# Patient Record
Sex: Male | Born: 1950 | Race: White | Hispanic: No | Marital: Married | Smoking: Former smoker
Health system: Southern US, Community
[De-identification: ages and names within clinical notes are randomized; demographics above are authoritative.]

## PROBLEM LIST (undated history)

## (undated) DIAGNOSIS — E039 Hypothyroidism, unspecified: Secondary | ICD-10-CM

## (undated) DIAGNOSIS — E785 Hyperlipidemia, unspecified: Secondary | ICD-10-CM

## (undated) DIAGNOSIS — R059 Cough, unspecified: Secondary | ICD-10-CM

## (undated) DIAGNOSIS — R911 Solitary pulmonary nodule: Secondary | ICD-10-CM

## (undated) DIAGNOSIS — K635 Polyp of colon: Secondary | ICD-10-CM

## (undated) DIAGNOSIS — M25569 Pain in unspecified knee: Secondary | ICD-10-CM

## (undated) DIAGNOSIS — N400 Enlarged prostate without lower urinary tract symptoms: Secondary | ICD-10-CM

## (undated) DIAGNOSIS — R05 Cough: Secondary | ICD-10-CM

## (undated) DIAGNOSIS — Z720 Tobacco use: Secondary | ICD-10-CM

## (undated) HISTORY — PX: OTHER SURGICAL HISTORY: SHX169

## (undated) HISTORY — PX: EYE SURGERY: SHX253

## (undated) HISTORY — DX: Hypothyroidism, unspecified: E03.9

## (undated) HISTORY — DX: Benign prostatic hyperplasia without lower urinary tract symptoms: N40.0

## (undated) HISTORY — DX: Hyperlipidemia, unspecified: E78.5

## (undated) HISTORY — DX: Tobacco use: Z72.0

## (undated) HISTORY — DX: Cough: R05

## (undated) HISTORY — DX: Solitary pulmonary nodule: R91.1

## (undated) HISTORY — DX: Pain in unspecified knee: M25.569

## (undated) HISTORY — DX: Cough, unspecified: R05.9

## (undated) HISTORY — DX: Polyp of colon: K63.5

## (undated) HISTORY — PX: TONSILLECTOMY: SUR1361

---

## 2009-06-16 LAB — HM MAMMOGRAPHY

## 2009-06-16 LAB — HM PAP SMEAR

## 2009-07-10 ENCOUNTER — Encounter: Admission: RE | Admit: 2009-07-10 | Discharge: 2009-07-10 | Payer: Self-pay | Admitting: Family Medicine

## 2009-12-12 ENCOUNTER — Encounter: Admission: RE | Admit: 2009-12-12 | Discharge: 2009-12-12 | Payer: Self-pay | Admitting: Family Medicine

## 2010-06-23 ENCOUNTER — Encounter: Payer: Self-pay | Admitting: Family Medicine

## 2010-12-04 ENCOUNTER — Other Ambulatory Visit: Payer: Self-pay | Admitting: Family Medicine

## 2010-12-04 DIAGNOSIS — R911 Solitary pulmonary nodule: Secondary | ICD-10-CM

## 2010-12-08 ENCOUNTER — Other Ambulatory Visit: Payer: Self-pay

## 2010-12-12 ENCOUNTER — Ambulatory Visit
Admission: RE | Admit: 2010-12-12 | Discharge: 2010-12-12 | Disposition: A | Discharge status: Home / Self Care | Payer: PRIVATE HEALTH INSURANCE | Source: Ambulatory Visit | Attending: Family Medicine | Admitting: Family Medicine

## 2010-12-12 DIAGNOSIS — R911 Solitary pulmonary nodule: Secondary | ICD-10-CM

## 2011-12-14 ENCOUNTER — Other Ambulatory Visit: Payer: Self-pay | Admitting: Family Medicine

## 2011-12-14 DIAGNOSIS — R911 Solitary pulmonary nodule: Secondary | ICD-10-CM

## 2011-12-16 ENCOUNTER — Ambulatory Visit
Admission: RE | Admit: 2011-12-16 | Discharge: 2011-12-16 | Disposition: A | Discharge status: Home / Self Care | Payer: PRIVATE HEALTH INSURANCE | Source: Ambulatory Visit | Attending: Family Medicine | Admitting: Family Medicine

## 2011-12-16 DIAGNOSIS — R911 Solitary pulmonary nodule: Secondary | ICD-10-CM

## 2012-05-12 ENCOUNTER — Other Ambulatory Visit: Payer: Self-pay | Admitting: Family Medicine

## 2012-05-31 ENCOUNTER — Other Ambulatory Visit: Payer: Self-pay | Admitting: Family Medicine

## 2012-05-31 ENCOUNTER — Encounter: Payer: Self-pay | Admitting: Family Medicine

## 2012-05-31 ENCOUNTER — Ambulatory Visit (INDEPENDENT_AMBULATORY_CARE_PROVIDER_SITE_OTHER): Payer: PRIVATE HEALTH INSURANCE | Admitting: Family Medicine

## 2012-05-31 VITALS — BP 99/55 | HR 58 | Temp 97.6°F | Ht 70.5 in | Wt 217.8 lb

## 2012-05-31 DIAGNOSIS — N400 Enlarged prostate without lower urinary tract symptoms: Secondary | ICD-10-CM

## 2012-05-31 DIAGNOSIS — E349 Endocrine disorder, unspecified: Secondary | ICD-10-CM | POA: Insufficient documentation

## 2012-05-31 DIAGNOSIS — E559 Vitamin D deficiency, unspecified: Secondary | ICD-10-CM

## 2012-05-31 DIAGNOSIS — R5383 Other fatigue: Secondary | ICD-10-CM | POA: Insufficient documentation

## 2012-05-31 DIAGNOSIS — Z862 Personal history of diseases of the blood and blood-forming organs and certain disorders involving the immune mechanism: Secondary | ICD-10-CM

## 2012-05-31 DIAGNOSIS — E785 Hyperlipidemia, unspecified: Secondary | ICD-10-CM | POA: Insufficient documentation

## 2012-05-31 DIAGNOSIS — Z8639 Personal history of other endocrine, nutritional and metabolic disease: Secondary | ICD-10-CM

## 2012-05-31 DIAGNOSIS — R5381 Other malaise: Secondary | ICD-10-CM

## 2012-05-31 DIAGNOSIS — J309 Allergic rhinitis, unspecified: Secondary | ICD-10-CM

## 2012-05-31 DIAGNOSIS — E291 Testicular hypofunction: Secondary | ICD-10-CM

## 2012-05-31 LAB — BASIC METABOLIC PANEL WITH GFR
Calcium: 9.7 mg/dL (ref 8.4–10.5)
Creat: 0.98 mg/dL (ref 0.50–1.35)
GFR, Est African American: >89 mL/min
GFR, Est Non African American: 83 mL/min
Sodium: 140 mEq/L (ref 135–145)

## 2012-05-31 LAB — POCT CBC
Lymph, poc: 2 (ref 0.6–3.4)
MCH, POC: 32.7 pg — AB (ref 27–31.2)
MCHC: 33.7 g/dL (ref 31.8–35.4)
MPV: 8.4 fL (ref 0–99.8)
POC Granulocyte: 4.8 (ref 2–6.9)
Platelet Count, POC: 201 10*3/uL (ref 142–424)
WBC: 7.2 10*3/uL (ref 4.6–10.2)

## 2012-05-31 LAB — HEPATIC FUNCTION PANEL
Albumin: 4.6 g/dL (ref 3.5–5.2)
Total Bilirubin: 0.5 mg/dL (ref 0.3–1.2)

## 2012-05-31 LAB — THYROID PANEL WITH TSH: T3 Uptake: 33.4 % (ref 22.5–37.0)

## 2012-05-31 MED ORDER — TESTOSTERONE 30 MG/ACT TD SOLN: 60.0000 mg | Freq: Every day | TRANSDERMAL | Status: DC

## 2012-05-31 NOTE — Patient Instructions (Addendum)
Fall precautions discussed Continue current meds and therapeutic lifestyle changes Start the Axiron and try the Cialis.  I've referred you back to Dr. Annabell Howells in 2 months for follow up, and I think it's important that you go. Try Nasacort AQ over-the-counter 1-2 sprays each nostril daily

## 2012-05-31 NOTE — Progress Notes (Signed)
  Subjective:    Patient ID: Austin Clark, male    DOB: February 15, 1951, 62 y.o.   MRN: [id number]  HPI Today we discussed his past visit with Dr.Wrenn The patient seems to have given up on any help offered by Dr.Wrenn for his testosterone deficiency. I did review the notes from Dr. Annabell Howells. He has tried Viagra and AndroGel without benefit. DR Annabell Howells wanted to retry another PD E5 inhibitor along with  testosterone replacement therapy.   Review of Systems  Constitutional: Negative for activity change and fatigue.  HENT: Negative.   Eyes: Positive for itching.  Respiratory: Negative.   Cardiovascular: Negative.   Gastrointestinal: Negative.   Endocrine: Negative.   Genitourinary: Negative.   Musculoskeletal: Negative.   Neurological: Negative.   Psychiatric/Behavioral: Negative.        Objective:   Physical Exam BP 99/55  Pulse 58  Temp(Src) 97.6 F (36.4 C) (Oral)  Ht 5' 10.5" (1.791 m)  Wt 217 lb 12.8 oz (98.793 kg)  BMI 30.8 kg/m2  The patient appeared well nourished and normally developed, alert and oriented to time and place. Speech, behavior and judgement appear normal. He has somewhat of a depressed affect today. Vital signs as documented.  Head exam is unremarkable. No scleral icterus or pallor noted. Nasal congestion bilaterally.  Neck is without jugular venous distension, thyromegally, or carotid bruits. Carotid upstrokes are brisk bilaterally. No cervical adenopathy. Lungs are clear anteriorly and posteriorly to auscultation. Normal respiratory effort. Cardiac exam reveals regular rate and rhythm @ 72/min. First and second heart sounds normal. No murmurs, rubs or gallops.  Abdominal exam reveals normal bowl sounds, no masses, no organomegaly and no aortic enlargement. No inguinal adenopathy. Extremities are nonedematous and both femoral and pedal pulses are normal. Skin without pallor or jaundice.  Warm and dry, without rash. Neurologic exam reveals normal deep tendon reflexes  and normal sensation.          Assessment & Plan:   1. Hyperlipidemia - Hepatic function panel; Standing - NMR Lipoprofile with Lipids; Standing  2. BPH (benign prostatic hyperplasia) - BASIC METABOLIC PANEL WITH GFR; Standing  3. History of hypothyroidism - Thyroid Panel With TSH  4. Testosterone deficiency - Testosterone 30 MG/ACT SOLN; Place 60 mg onto the skin daily. 1 pump each axilla daily  Dispense: 90 mL; Refill: 5 - Ambulatory referral to Urology - Testosterone  5. Fatigue - POCT CBC; Standing - BASIC METABOLIC PANEL WITH GFR; Standing  6. Vitamin D deficiency- Vitamin D 25 hydroxy; Standing  7. Allergic rhinitis       Patient Instructions  Fall precautions discussed Continue current meds and therapeutic lifestyle changes Start the Axiron and try the Cialis.  I've referred you back to Dr. Annabell Howells in 2 months for follow up, and I think it's important that you go. Try Nasacort AQ over-the-counter 1-2 sprays each nostril daily

## 2012-06-01 LAB — NMR LIPOPROFILE WITH LIPIDS
HDL Size: 9.2 nm (ref >=9.2)
HDL-C: 25 mg/dL — ABNORMAL LOW (ref >=40)
LDL (calc): 52 mg/dL (ref <100)
LDL Particle Number: 842 nmol/L (ref <1000)
LDL Size: 19.5 nm — ABNORMAL LOW (ref >20.5)
LP-IR Score: 53 — ABNORMAL HIGH (ref <=45)
Large HDL-P: <1.3 umol/L — ABNORMAL LOW (ref >=4.8)
VLDL Size: 45.1 nm (ref <=46.6)

## 2012-06-01 LAB — VITAMIN D 25 HYDROXY (VIT D DEFICIENCY, FRACTURES): Vit D, 25-Hydroxy: 33 ng/mL (ref 30–89)

## 2012-06-06 LAB — TESTOSTERONE, TOTAL AND FREE DIRECT MEASURE: Testosterone: 350 ng/dL (ref 300–890)

## 2012-06-07 NOTE — Addendum Note (Signed)
Addended by: Orma Render F on: 06/07/2012 02:00 PM   Modules accepted: Orders

## 2012-06-08 ENCOUNTER — Other Ambulatory Visit (INDEPENDENT_AMBULATORY_CARE_PROVIDER_SITE_OTHER): Payer: PRIVATE HEALTH INSURANCE

## 2012-06-08 DIAGNOSIS — E349 Endocrine disorder, unspecified: Secondary | ICD-10-CM

## 2012-06-08 DIAGNOSIS — E291 Testicular hypofunction: Secondary | ICD-10-CM

## 2012-06-08 NOTE — Progress Notes (Signed)
Patient came in for labs only.

## 2012-06-22 ENCOUNTER — Other Ambulatory Visit: Payer: Self-pay | Admitting: *Deleted

## 2012-06-22 DIAGNOSIS — R7989 Other specified abnormal findings of blood chemistry: Secondary | ICD-10-CM

## 2012-11-02 ENCOUNTER — Encounter: Payer: Self-pay | Admitting: Family Medicine

## 2012-11-02 ENCOUNTER — Ambulatory Visit (INDEPENDENT_AMBULATORY_CARE_PROVIDER_SITE_OTHER): Payer: 59 | Admitting: Family Medicine

## 2012-11-02 VITALS — BP 121/68 | HR 64 | Temp 99.8°F | Ht 70.5 in | Wt 207.0 lb

## 2012-11-02 DIAGNOSIS — Z0000 Encounter for general adult medical examination without abnormal findings: Secondary | ICD-10-CM

## 2012-11-02 DIAGNOSIS — R5383 Other fatigue: Secondary | ICD-10-CM

## 2012-11-02 DIAGNOSIS — Z8639 Personal history of other endocrine, nutritional and metabolic disease: Secondary | ICD-10-CM

## 2012-11-02 DIAGNOSIS — N400 Enlarged prostate without lower urinary tract symptoms: Secondary | ICD-10-CM

## 2012-11-02 DIAGNOSIS — E559 Vitamin D deficiency, unspecified: Secondary | ICD-10-CM

## 2012-11-02 DIAGNOSIS — E785 Hyperlipidemia, unspecified: Secondary | ICD-10-CM

## 2012-11-02 DIAGNOSIS — E349 Endocrine disorder, unspecified: Secondary | ICD-10-CM

## 2012-11-02 LAB — POCT URINALYSIS DIPSTICK
Bilirubin, UA: NEGATIVE
Glucose, UA: NEGATIVE
Leukocytes, UA: NEGATIVE
Nitrite, UA: NEGATIVE
pH, UA: 5

## 2012-11-02 LAB — POCT CBC
MCH, POC: 32.7 pg — AB (ref 27–31.2)
MPV: 8.5 fL (ref 0–99.8)
POC Granulocyte: 6.7 (ref 2–6.9)
Platelet Count, POC: 190 10*3/uL (ref 142–424)
RBC: 4.7 M/uL (ref 4.69–6.13)
WBC: 9.5 10*3/uL (ref 4.6–10.2)

## 2012-11-02 LAB — BASIC METABOLIC PANEL WITH GFR
Calcium: 9.8 mg/dL (ref 8.4–10.5)
Chloride: 104 mEq/L (ref 96–112)
Creat: 0.93 mg/dL (ref 0.50–1.35)
GFR, Est Non African American: 88 mL/min

## 2012-11-02 LAB — HEPATIC FUNCTION PANEL
Albumin: 4.4 g/dL (ref 3.5–5.2)
Total Bilirubin: 0.5 mg/dL (ref 0.3–1.2)

## 2012-11-02 LAB — POCT UA - MICROSCOPIC ONLY
WBC, Ur, HPF, POC: NEGATIVE
Yeast, UA: NEGATIVE

## 2012-11-02 NOTE — Patient Instructions (Addendum)
Do not forget to get your flu shot in October Continue current medication Continue therapeutic lifestyle changes Drink plenty of fluids Always be careful and did not put yourself at risk for falling When lab work becomes available we will call you with the results

## 2012-11-02 NOTE — Progress Notes (Signed)
Subjective:    Patient ID: Austin Clark, male    DOB: 12/12/1950, 62 y.o.   MRN: [id number]  HPI Patient is in today for his annual physical exam. He was followed by the urologist for his testosterone deficiency, but did not do any followup appointments and says that the treatment did not help him and that he does not want to restart treatment.   Review of Systems  Constitutional: Negative.   HENT: Negative.   Eyes: Negative.   Respiratory: Negative.   Cardiovascular: Negative.   Gastrointestinal: Negative.   Endocrine: Negative.   Genitourinary: Negative.   Musculoskeletal: Negative.   Skin: Negative.   Allergic/Immunologic: Negative.   Neurological: Negative.   Hematological: Negative.   Psychiatric/Behavioral: Negative.    Incidentally patient notes that he has some trouble swallowing fluids when he is physically active. This does not occur at other times. There is no solid food dysphagia.     Objective:   Physical Exam BP 121/68  Pulse 64  Temp(Src) 99.8 F (37.7 C) (Oral)  Ht 5' 10.5" (1.791 m)  Wt 207 lb (93.895 kg)  BMI 29.27 kg/m2  The patient appeared well nourished and normally developed, alert and oriented to time and place. Speech, behavior and judgement appear normal. He has a very quiet demeanor as usual. Vital signs as documented.  Head exam is unremarkable. No scleral icterus or pallor noted. Ears nose and throat were normal. Neck is without jugular venous distension, thyromegally, or carotid bruits. Carotid upstrokes are brisk bilaterally. No cervical adenopathy. Lungs are clear anteriorly and posteriorly to auscultation. Normal respiratory effort. Cardiac exam reveals regular rate and rhythm at 72 per minute. First and second heart sounds normal.  No murmurs, rubs or gallops.  Abdominal exam reveals normal bowl sounds, no masses, no organomegaly and no aortic enlargement. No inguinal adenopathy. There was no, no tenderness. Rectal exam did not reveal any  rectal masses. The prostate was slightly enlarged and smooth to palpation. The external genitalia were normal and there was no sign of any hernia. Extremities are nonedematous and both femoral and pedal pulses are normal. Skin without pallor or jaundice.  Warm and dry, without rash. Neurologic exam reveals normal deep tendon reflexes and normal sensation.  Results for orders placed in visit on 11/02/12  POCT CBC      Result Value Range   WBC 9.5  4.6 - 10.2 K/uL   Lymph, poc 2.6  0.6 - 3.4   POC LYMPH PERCENT 27.8  10 - 50 %L   MID (cbc)    0 - 0.9   POC MID %    0 - 12 %M   POC Granulocyte 6.7  2 - 6.9   Granulocyte percent 70.7  37 - 80 %G   RBC 4.7  4.69 - 6.13 M/uL   Hemoglobin 15.4  14.1 - 18.1 g/dL   HCT, POC 40.9  81.1 - 53.7 %   MCV 97.5 (*) 80 - 97 fL   MCH, POC 32.7 (*) 27 - 31.2 pg   MCHC 33.6  31.8 - 35.4 g/dL   RDW, POC 91.4     Platelet Count, POC 190.0  142 - 424 K/uL   MPV 8.5  0 - 99.8 fL   This was reviewed with patient at this visit        Assessment & Plan:  1. Fatigue - POCT CBC - BASIC METABOLIC PANEL WITH GFR - PSA, total and free - EKG 12-Lead  2. BPH (benign  prostatic hyperplasia) - BASIC METABOLIC PANEL WITH GFR - PSA, total and free  3. Hyperlipidemia - Hepatic function panel - NMR Lipoprofile with Lipids  4. Vitamin D deficiency - Vitamin D 25 hydroxy  5. History of hypothyroidism  6. Testosterone deficiency  - PSA, total and free  7. Annual physical exam - PSA, total and free - POCT UA - Microscopic Only - POCT urinalysis dipstick - EKG 12-Lead  8. Family history is positive for colon cancer  9. Difficulty swallowing liquids after exercise and when he is overheated -If problems continue patient will let me know and we will schedule him for an endoscopy   Patient Instructions  Do not forget to get your flu shot in October Continue current medication Continue therapeutic lifestyle changes Drink plenty of fluids Always  be careful and did not put yourself at risk for falling When lab work becomes available we will call you with the results   Nyra Capes MD

## 2012-11-03 LAB — NMR LIPOPROFILE WITH LIPIDS
HDL Size: 10 nm (ref >=9.2)
HDL-C: 28 mg/dL — ABNORMAL LOW (ref >=40)
LDL (calc): 75 mg/dL (ref <100)
LDL Size: 19.8 nm — ABNORMAL LOW (ref >20.5)
LP-IR Score: 50 — ABNORMAL HIGH (ref <=45)

## 2012-11-03 LAB — VITAMIN D 25 HYDROXY (VIT D DEFICIENCY, FRACTURES): Vit D, 25-Hydroxy: 46 ng/mL (ref 30–89)

## 2012-11-10 ENCOUNTER — Other Ambulatory Visit (INDEPENDENT_AMBULATORY_CARE_PROVIDER_SITE_OTHER): Payer: Managed Care, Other (non HMO)

## 2012-11-10 DIAGNOSIS — Z1212 Encounter for screening for malignant neoplasm of rectum: Secondary | ICD-10-CM

## 2012-11-11 ENCOUNTER — Telehealth: Payer: Self-pay | Admitting: Family Medicine

## 2012-11-14 ENCOUNTER — Encounter: Payer: Self-pay | Admitting: *Deleted

## 2012-11-17 NOTE — Telephone Encounter (Signed)
NOTIFIED OF LABS ON 11/14/12

## 2013-05-09 ENCOUNTER — Ambulatory Visit (INDEPENDENT_AMBULATORY_CARE_PROVIDER_SITE_OTHER): Payer: 59 | Admitting: Family Medicine

## 2013-05-09 ENCOUNTER — Encounter: Payer: Self-pay | Admitting: Family Medicine

## 2013-05-09 VITALS — BP 108/66 | HR 59 | Temp 97.7°F | Ht 70.5 in | Wt 221.0 lb

## 2013-05-09 DIAGNOSIS — E291 Testicular hypofunction: Secondary | ICD-10-CM

## 2013-05-09 DIAGNOSIS — J310 Chronic rhinitis: Secondary | ICD-10-CM

## 2013-05-09 DIAGNOSIS — E349 Endocrine disorder, unspecified: Secondary | ICD-10-CM

## 2013-05-09 DIAGNOSIS — Z862 Personal history of diseases of the blood and blood-forming organs and certain disorders involving the immune mechanism: Secondary | ICD-10-CM

## 2013-05-09 DIAGNOSIS — Z8639 Personal history of other endocrine, nutritional and metabolic disease: Secondary | ICD-10-CM

## 2013-05-09 DIAGNOSIS — N400 Enlarged prostate without lower urinary tract symptoms: Secondary | ICD-10-CM

## 2013-05-09 DIAGNOSIS — E559 Vitamin D deficiency, unspecified: Secondary | ICD-10-CM

## 2013-05-09 DIAGNOSIS — E785 Hyperlipidemia, unspecified: Secondary | ICD-10-CM

## 2013-05-09 MED ORDER — OMEGA-3-ACID ETHYL ESTERS 1 G PO CAPS: ORAL_CAPSULE | ORAL | Status: DC

## 2013-05-09 MED ORDER — ROSUVASTATIN CALCIUM 40 MG PO TABS
ORAL_TABLET | ORAL | Status: DC
Start: 2013-05-09 — End: 2014-08-22

## 2013-05-09 MED ORDER — NIACIN ER (ANTIHYPERLIPIDEMIC) 500 MG PO TBCR: EXTENDED_RELEASE_TABLET | ORAL | Status: DC

## 2013-05-09 NOTE — Progress Notes (Signed)
Subjective:    Patient ID: Austin Clark, male    DOB: [birth year], 63 y.o.   MRN: [id number]  HPI Pt here for follow up and management of chronic medical problems.        Patient Active Problem List   Diagnosis Date Noted  . Hyperlipidemia 05/31/2012  . BPH (benign prostatic hyperplasia) 05/31/2012  . History of hypothyroidism 05/31/2012  . Testosterone deficiency 05/31/2012  . Vitamin D deficiency 05/31/2012  . Fatigue 05/31/2012   Outpatient Encounter Prescriptions as of 05/09/2013  Medication Sig  . aspirin (LONGS ADULT LOW STRENGTH ASA) 81 MG EC tablet Take 81 mg by mouth daily.    . CRESTOR 40 MG tablet TAKE 1 TABLET EVERY DAY  . Latanoprost (XALATAN OP) Apply to eye daily.    Marland Kitchen LOVAZA 1 G capsule TAKE 2 CAPSULE BY MOUTH TWICE A DAY AS DIRECTED  . NIASPAN 500 MG CR tablet TAKE 2 TABLETS AT BEDTIME  . [DISCONTINUED] Testosterone 30 MG/ACT SOLN Place 60 mg onto the skin daily. 1 pump each axilla daily    Review of Systems  Constitutional: Negative.   HENT: Negative.   Eyes: Negative.   Respiratory: Negative.   Cardiovascular: Negative.   Gastrointestinal: Negative.   Endocrine: Negative.   Genitourinary: Negative.   Musculoskeletal: Negative.   Skin: Negative.   Allergic/Immunologic: Negative.   Neurological: Negative.   Hematological: Negative.   Psychiatric/Behavioral: Negative.        Objective:   Physical Exam  Nursing note and vitals reviewed. Constitutional: He is oriented to person, place, and time. He appears well-developed and well-nourished. No distress.  HENT:  Head: Normocephalic and atraumatic.  Right Ear: External ear normal.  Left Ear: External ear normal.  Mouth/Throat: Oropharynx is clear and moist. No oropharyngeal exudate.  Some nasal congestion bilaterally  Eyes: Conjunctivae and EOM are normal. Pupils are equal, round, and reactive to light. Right eye exhibits no discharge. Left eye exhibits no discharge. No scleral icterus.  Neck:  Normal range of motion. Neck supple. No thyromegaly present.  No carotid bruits  Cardiovascular: Normal rate, regular rhythm, normal heart sounds and intact distal pulses.   No murmur heard. At 72 per minute  Pulmonary/Chest: Effort normal and breath sounds normal. No respiratory distress. He has no wheezes. He has no rales. He exhibits no tenderness.  Abdominal: Soft. Bowel sounds are normal. He exhibits no mass. There is no tenderness. There is no rebound and no guarding.  No abdominal tenderness  Genitourinary: Rectum normal, prostate normal and penis normal.  Musculoskeletal: Normal range of motion. He exhibits no edema and no tenderness.  Lymphadenopathy:    He has no cervical adenopathy.  Neurological: He is alert and oriented to person, place, and time. He has normal reflexes. No cranial nerve deficit.  Skin: Skin is warm and dry. No rash noted. No erythema. No pallor.  Psychiatric: He has a normal mood and affect. His behavior is normal. Judgment and thought content normal.   BP 108/66  Pulse 59  Temp(Src) 97.7 F (36.5 C) (Oral)  Ht 5' 10.5" (1.791 m)  Wt 221 lb (100.245 kg)  BMI 31.25 kg/m2        Assessment & Plan:  1. BPH (benign prostatic hyperplasia)  2. History of hypothyroidism  3. Hyperlipidemia  4. Testosterone deficiency  5. Vitamin D deficiency  6. Rhinitis  All of the patient's medications will be refilled for 6 months.   Patient Instructions  Continue current medications. Continue good therapeutic lifestyle  changes which include good diet and exercise. Fall precautions discussed with patient. If an FOBT was given today- please return it to our front desk. If you are over 35 years old - you may need Prevnar 51 or the adult Pneumonia vaccine.  Be sure and check with your insurance about the Prevnar vaccine Continue with aggressive diet habits The new coupon for Crestor should help save you some money Continue to drink plenty of  fluids Remember that your next colonoscopy will be due you in October of 2017 If you develop problems with nasal congestion he can now purchase Flonase or Nasacort over-the-counter and he can use 1-2 sprays in each nostril at bedtime   Arrie Senate MD

## 2013-05-09 NOTE — Patient Instructions (Addendum)
Continue current medications. Continue good therapeutic lifestyle changes which include good diet and exercise. Fall precautions discussed with patient. If an FOBT was given today- please return it to our front desk. If you are over 62 years old - you may need Prevnar 12 or the adult Pneumonia vaccine.  Be sure and check with your insurance about the Prevnar vaccine Continue with aggressive diet habits The new coupon for Crestor should help save you some money Continue to drink plenty of fluids Remember that your next colonoscopy will be due you in October of 2017 If you develop problems with nasal congestion he can now purchase Flonase or Nasacort over-the-counter and he can use 1-2 sprays in each nostril at bedtime

## 2013-05-09 NOTE — Addendum Note (Signed)
Addended by: Zannie Cove on: 05/09/2013 08:42 AM   Modules accepted: Orders

## 2013-11-09 ENCOUNTER — Encounter: Payer: Self-pay | Admitting: Family Medicine

## 2013-11-09 ENCOUNTER — Ambulatory Visit (INDEPENDENT_AMBULATORY_CARE_PROVIDER_SITE_OTHER): Payer: 59 | Admitting: Family Medicine

## 2013-11-09 ENCOUNTER — Ambulatory Visit (INDEPENDENT_AMBULATORY_CARE_PROVIDER_SITE_OTHER): Payer: 59

## 2013-11-09 VITALS — BP 99/65 | HR 53 | Temp 98.7°F | Ht 70.5 in | Wt 221.0 lb

## 2013-11-09 DIAGNOSIS — E785 Hyperlipidemia, unspecified: Secondary | ICD-10-CM

## 2013-11-09 DIAGNOSIS — M79609 Pain in unspecified limb: Secondary | ICD-10-CM

## 2013-11-09 DIAGNOSIS — M79672 Pain in left foot: Secondary | ICD-10-CM

## 2013-11-09 DIAGNOSIS — Z0000 Encounter for general adult medical examination without abnormal findings: Secondary | ICD-10-CM

## 2013-11-09 DIAGNOSIS — Z8639 Personal history of other endocrine, nutritional and metabolic disease: Secondary | ICD-10-CM

## 2013-11-09 DIAGNOSIS — E349 Endocrine disorder, unspecified: Secondary | ICD-10-CM

## 2013-11-09 DIAGNOSIS — Z800 Family history of malignant neoplasm of digestive organs: Secondary | ICD-10-CM

## 2013-11-09 DIAGNOSIS — E291 Testicular hypofunction: Secondary | ICD-10-CM

## 2013-11-09 DIAGNOSIS — Z862 Personal history of diseases of the blood and blood-forming organs and certain disorders involving the immune mechanism: Secondary | ICD-10-CM

## 2013-11-09 DIAGNOSIS — L819 Disorder of pigmentation, unspecified: Secondary | ICD-10-CM

## 2013-11-09 DIAGNOSIS — N400 Enlarged prostate without lower urinary tract symptoms: Secondary | ICD-10-CM

## 2013-11-09 DIAGNOSIS — E559 Vitamin D deficiency, unspecified: Secondary | ICD-10-CM

## 2013-11-09 LAB — POCT UA - MICROSCOPIC ONLY
Casts, Ur, LPF, POC: NEGATIVE
Crystals, Ur, HPF, POC: NEGATIVE
Mucus, UA: NEGATIVE
WBC, UR, HPF, POC: 1.3
Yeast, UA: NEGATIVE

## 2013-11-09 LAB — POCT CBC
GRANULOCYTE PERCENT: 62.2 % (ref 37–80)
HCT, POC: 43.4 % — AB (ref 43.5–53.7)
HEMOGLOBIN: 14.4 g/dL (ref 14.1–18.1)
Lymph, poc: 2.1 (ref 0.6–3.4)
MCH, POC: 32.3 pg — AB (ref 27–31.2)
MCHC: 33.1 g/dL (ref 31.8–35.4)
MCV: 97.4 fL — AB (ref 80–97)
MPV: 8.2 fL (ref 0–99.8)
POC Granulocyte: 3.9 (ref 2–6.9)
POC LYMPH %: 33.5 % (ref 10–50)
Platelet Count, POC: 203 10*3/uL (ref 142–424)
RBC: 4.5 M/uL — AB (ref 4.69–6.13)
RDW, POC: 13.4 %
WBC: 6.3 10*3/uL (ref 4.6–10.2)

## 2013-11-09 LAB — POCT URINALYSIS DIPSTICK
BILIRUBIN UA: NEGATIVE
Glucose, UA: NEGATIVE
KETONES UA: NEGATIVE
Leukocytes, UA: NEGATIVE
Nitrite, UA: NEGATIVE
Protein, UA: NEGATIVE
SPEC GRAV UA: 1.025
UROBILINOGEN UA: NEGATIVE
pH, UA: 5

## 2013-11-09 NOTE — Progress Notes (Signed)
Subjective:    Patient ID: Austin Clark, male    DOB: 1950/09/03, 63 y.o.   MRN: [id number]  HPI Patient is here today for annual wellness exam and follow up of chronic medical problems. The patient is concerned about some soreness in the left foot. He also has an area on his scalp that he would like for [place] to look at.         Patient Active Problem List   Diagnosis Date Noted  . Hyperlipidemia 05/31/2012  . BPH (benign prostatic hyperplasia) 05/31/2012  . History of hypothyroidism 05/31/2012  . Testosterone deficiency 05/31/2012  . Vitamin D deficiency 05/31/2012  . Fatigue 05/31/2012   Outpatient Encounter Prescriptions as of 11/09/2013  Medication Sig  . aspirin (LONGS ADULT LOW STRENGTH ASA) 81 MG EC tablet Take 81 mg by mouth daily.    . Latanoprost (XALATAN OP) Apply to eye daily.    . niacin (NIASPAN) 500 MG CR tablet TAKE 2 TABLETS AT BEDTIME  . omega-3 acid ethyl esters (LOVAZA) 1 G capsule TAKE 2 CAPSULE BY MOUTH TWICE A DAY AS DIRECTED  . rosuvastatin (CRESTOR) 40 MG tablet TAKE 1 TABLET EVERY DAY    Review of Systems  Constitutional: Negative.   HENT: Negative.   Eyes: Negative.   Respiratory: Negative.   Cardiovascular: Negative.   Gastrointestinal: Negative.   Endocrine: Negative.   Genitourinary: Negative.   Musculoskeletal: Negative.        Left - outer foot soreness  Skin: Negative.        Skin lesion - right side scalp  Allergic/Immunologic: Negative.   Neurological: Negative.   Hematological: Negative.   Psychiatric/Behavioral: Negative.        Objective:   Physical Exam  Nursing note and vitals reviewed. Constitutional: He is oriented to person, place, and time. He appears well-developed and well-nourished. No distress.  The patient is calm and alert.  HENT:  Head: Normocephalic and atraumatic.  Right Ear: External ear normal.  Left Ear: External ear normal.  Mouth/Throat: Oropharynx is clear and moist. No oropharyngeal exudate.  There  is nasal congestion and turbinate swelling bilaterally  Eyes: Conjunctivae and EOM are normal. Pupils are equal, round, and reactive to light. Right eye exhibits no discharge. Left eye exhibits no discharge. No scleral icterus.  Neck: Normal range of motion. Neck supple. No thyromegaly present.  No carotid bruits or thyromegaly  Cardiovascular: Normal rate, regular rhythm, normal heart sounds and intact distal pulses.  Exam reveals no gallop and no friction rub.   No murmur heard. The heart is regular at 60 per minute  Pulmonary/Chest: Effort normal and breath sounds normal. No respiratory distress. He has no wheezes. He has no rales. He exhibits no tenderness.  There is no axillary adenopathy  Abdominal: Soft. Bowel sounds are normal. He exhibits no mass. There is no tenderness. There is no rebound and no guarding.  The abdomen is nontender without masses or inguinal adenopathy  Genitourinary: Rectum normal and penis normal.  The prostate is enlarged and smooth without lumps or masses. There are no rectal masses. There is no inguinal hernias palpated. The external genitalia were normal.  Musculoskeletal: Normal range of motion. He exhibits no edema and no tenderness.  The fifth meta-tarsal carpal joint was tender to palpation on the left foot. There is minimal swelling, no redness or rubor.  Lymphadenopathy:    He has no cervical adenopathy.  Neurological: He is alert and oriented to person, place, and time. He has  normal reflexes. No cranial nerve deficit.  Skin: Skin is warm and dry. No rash noted. No erythema. No pallor.  The patient has 2 large flat discolored lesions on his scalp at the temple area and the superior scalp on the right side. These appear to be more seborrheic keratoses. There is no redness or irritation noted  Psychiatric: He has a normal mood and affect. His behavior is normal. Judgment and thought content normal.   BP 99/65  Pulse 53  Temp(Src) 98.7 F (37.1 C) (Oral)   Ht 5' 10.5" (1.791 m)  Wt 221 lb (100.245 kg)  BMI 31.25 kg/m2  [facility] reading (PRIMARY) by  Dr. Brunilda Payor x-ray- no active disease                                                                                                     Left foot-minimal degenerative change       Assessment & Plan:  1. BPH (benign prostatic hyperplasia) - POCT CBC - Testosterone,Free and Total  2. Hyperlipidemia - POCT CBC - BMP8+EGFR - Hepatic function panel - NMR, lipoprofile - DG Chest 2 View; Future  3. History of hypothyroidism - POCT CBC - NMR, lipoprofile  4. Testosterone deficiency - POCT CBC - POCT UA - Microscopic Only - POCT urinalysis dipstick - PSA, total and free - Testosterone,Free and Total  5. Vitamin D deficiency - POCT CBC - Vit D  25 hydroxy (rtn osteoporosis monitoring)  6. BPH (benign prostatic hyperplasia) - POCT CBC - Testosterone,Free and Total  7. Annual physical exam - POCT CBC - POCT UA - Microscopic Only - POCT urinalysis dipstick - BMP8+EGFR - Hepatic function panel - PSA, total and free - Vit D  25 hydroxy (rtn osteoporosis monitoring) - Testosterone,Free and Total - NMR, lipoprofile - DG Chest 2 View; Future  8. Atypical pigmented skin lesion -Refer to dermatology  9. Left foot pain  10. Family history of colon cancer  No orders of the defined types were placed in this encounter.   Patient Instructions  Continue current medications. Continue good therapeutic lifestyle changes which include good diet and exercise. Fall precautions discussed with patient. If an FOBT was given today- please return it to our front desk. If you are over 2 years old - you may need Prevnar 32 or the adult Pneumonia vaccine.  Flu Shots will be available at our office starting mid- September. Please call and schedule a FLU CLINIC APPOINTMENT.   We will make arrangements for you to visit the dermatologist regarding the skin lesions on your scalp Call you  with your lab work once those results are available please return to the FOBT We will also call you with your chest x-ray results when those results are available Be sure and check with your insurance regarding the Prevnar vaccine Check with your insurance about the cost for Vascepa versus the cost for Lovaza    Arrie Senate MD

## 2013-11-09 NOTE — Patient Instructions (Addendum)
Continue current medications. Continue good therapeutic lifestyle changes which include good diet and exercise. Fall precautions discussed with patient. If an FOBT was given today- please return it to our front desk. If you are over 62 years old - you may need Prevnar 60 or the adult Pneumonia vaccine.  Flu Shots will be available at our office starting mid- September. Please call and schedule a FLU CLINIC APPOINTMENT.   We will make arrangements for you to visit the dermatologist regarding the skin lesions on your scalp Call you with your lab work once those results are available please return to the FOBT We will also call you with your chest x-ray results when those results are available Be sure and check with your insurance regarding the Prevnar vaccine Check with your insurance about the cost for Vascepa versus the cost for Lovaza

## 2013-11-10 ENCOUNTER — Telehealth: Payer: Self-pay

## 2013-11-10 LAB — HEPATIC FUNCTION PANEL
ALT: 21 IU/L (ref 0–44)
AST: 18 IU/L (ref 0–40)
Albumin: 4.3 g/dL (ref 3.6–4.8)
Alkaline Phosphatase: 59 IU/L (ref 39–117)
Bilirubin, Direct: 0.1 mg/dL (ref 0.00–0.40)
Total Bilirubin: 0.3 mg/dL (ref 0.0–1.2)
Total Protein: 6.4 g/dL (ref 6.0–8.5)

## 2013-11-10 LAB — BMP8+EGFR
BUN/Creatinine Ratio: 13 (ref 10–22)
BUN: 13 mg/dL (ref 8–27)
CHLORIDE: 104 mmol/L (ref 97–108)
CO2: 28 mmol/L (ref 18–29)
Calcium: 9.2 mg/dL (ref 8.6–10.2)
Creatinine, Ser: 1.02 mg/dL (ref 0.76–1.27)
GFR calc Af Amer: 91 mL/min/{1.73_m2} (ref >59)
GFR, EST NON AFRICAN AMERICAN: 78 mL/min/{1.73_m2} (ref >59)
GLUCOSE: 104 mg/dL — AB (ref 65–99)
Potassium: 4.7 mmol/L (ref 3.5–5.2)
Sodium: 144 mmol/L (ref 134–144)

## 2013-11-10 LAB — TESTOSTERONE,FREE AND TOTAL
Testosterone, Free: 7.6 pg/mL (ref 6.6–18.1)
Testosterone: 488 ng/dL (ref 348–1197)

## 2013-11-10 LAB — NMR, LIPOPROFILE
Cholesterol: 122 mg/dL (ref 100–199)
HDL Cholesterol by NMR: 27 mg/dL — ABNORMAL LOW (ref >39)
HDL PARTICLE NUMBER: 26.7 umol/L — AB (ref >=30.5)
LDL Particle Number: 1146 nmol/L — ABNORMAL HIGH (ref <1000)
LDL Size: 19.6 nm (ref >20.5)
LDLC SERPL CALC-MCNC: 69 mg/dL (ref 0–99)
LP-IR Score: 54 — ABNORMAL HIGH (ref <=45)
SMALL LDL PARTICLE NUMBER: 941 nmol/L — AB (ref <=527)
Triglycerides by NMR: 131 mg/dL (ref 0–149)

## 2013-11-10 LAB — PSA, TOTAL AND FREE
PSA FREE: 0.19 ng/mL
PSA, Free Pct: 31.7 %
PSA: 0.6 ng/mL (ref 0.0–4.0)

## 2013-11-10 LAB — VITAMIN D 25 HYDROXY (VIT D DEFICIENCY, FRACTURES): Vit D, 25-Hydroxy: 38.3 ng/mL (ref 30.0–100.0)

## 2013-11-10 NOTE — Telephone Encounter (Signed)
Pt aware of xray results

## 2013-11-10 NOTE — Telephone Encounter (Signed)
Message copied by Koren Bound on Fri Nov 10, 2013 10:24 AM ------      Message from: Chipper Herb      Created: Thu Nov 09, 2013  3:50 PM       As per radiology report ------

## 2013-11-13 ENCOUNTER — Telehealth: Payer: Self-pay | Admitting: Family Medicine

## 2013-11-13 ENCOUNTER — Other Ambulatory Visit: Payer: 59

## 2013-11-13 DIAGNOSIS — Z1212 Encounter for screening for malignant neoplasm of rectum: Secondary | ICD-10-CM

## 2013-11-13 NOTE — Telephone Encounter (Signed)
Message copied by Waverly Ferrari on Mon Nov 13, 2013  9:36 AM ------      Message from: Chipper Herb      Created: Fri Nov 10, 2013  3:22 PM       The CBC has a normal white blood cell count. The hemoglobin is good at 14.4. The platelet count is adequate       The blood sugar was elevated at 104. This number should be less than 100. The creatinine, the most important kidney function test is within normal limits. The electrolytes including potassium are within normal limits.      All liver function tests are within normal      The PSA test is low and within normal limits      The vitamin D level is 38.3. Increase vitamin D3 by 1000 daily      The total trouble is within normal limits. The free direct testosterone is also within normal limits.      With advanced lipid testing, the titled LDL particle number is elevated at 1146. This is slightly higher than it was one year ago. The LDL C. is good at 69. Triglycerides are within normal limits at 131. The good cholesterol the HDL particle number remains low as it has been in the past. This is improved by more exercise and weight loss. Continue Crestor and as aggressive therapeutic lifestyle changes as possible which include diet and exercise ------

## 2013-11-13 NOTE — Progress Notes (Signed)
Lab only 

## 2013-11-13 NOTE — Telephone Encounter (Signed)
Labs

## 2013-11-14 LAB — FECAL OCCULT BLOOD, IMMUNOCHEMICAL: FECAL OCCULT BLD: NEGATIVE

## 2013-12-23 ENCOUNTER — Ambulatory Visit (INDEPENDENT_AMBULATORY_CARE_PROVIDER_SITE_OTHER): Payer: 59 | Admitting: Family Medicine

## 2013-12-23 VITALS — BP 114/70 | HR 65 | Temp 98.7°F | Ht 70.5 in | Wt 218.0 lb

## 2013-12-23 DIAGNOSIS — J069 Acute upper respiratory infection, unspecified: Secondary | ICD-10-CM

## 2013-12-23 MED ORDER — AZITHROMYCIN 250 MG PO TABS: ORAL_TABLET | ORAL | Status: DC

## 2013-12-23 MED ORDER — BENZONATATE 100 MG PO CAPS: 100.0000 mg | ORAL_CAPSULE | Freq: Three times a day (TID) | ORAL | Status: DC | PRN

## 2013-12-23 NOTE — Progress Notes (Signed)
   Subjective:    Patient ID: Austin Clark, male    DOB: 06-24-1950, 63 y.o.   MRN: [id number]  HPI C/o uri sx's  Review of Systems  Constitutional: Negative for fever.  HENT: Negative for ear pain.   Eyes: Negative for discharge.  Respiratory: Negative for cough.   Cardiovascular: Negative for chest pain.  Gastrointestinal: Negative for abdominal distention.  Endocrine: Negative for polyuria.  Genitourinary: Negative for difficulty urinating.  Musculoskeletal: Negative for gait problem and neck pain.  Skin: Negative for color change and rash.  Neurological: Negative for speech difficulty and headaches.  Psychiatric/Behavioral: Negative for agitation.       Objective:    BP 114/70 mmHg  Pulse 65  Temp(Src) 98.7 F (37.1 C) (Oral)  Ht 5' 10.5" (1.791 m)  Wt 218 lb (98.884 kg)  BMI 30.83 kg/m2 Physical Exam  Constitutional: He is oriented to person, place, and time. He appears well-developed and well-nourished.  HENT:  Head: Normocephalic and atraumatic.  Mouth/Throat: Oropharynx is clear and moist.  Eyes: Pupils are equal, round, and reactive to light.  Neck: Normal range of motion. Neck supple.  Cardiovascular: Normal rate and regular rhythm.   No murmur heard. Pulmonary/Chest: Effort normal and breath sounds normal.  Abdominal: Soft. Bowel sounds are normal. There is no tenderness.  Neurological: He is alert and oriented to person, place, and time.  Skin: Skin is warm and dry.  Psychiatric: He has a normal mood and affect.          Assessment & Plan:     ICD-9-CM ICD-10-CM   1. URI (upper respiratory infection) 465.9 J06.9 benzonatate (TESSALON PERLES) 100 MG capsule     azithromycin (ZITHROMAX) 250 MG tablet   Push po fluids, rest, tylenol and motrin otc prn as directed for fever, arthralgias, and myalgias.  Follow up prn if sx's continue or persist.  No Follow-up on file.  Lysbeth Penner FNP

## 2014-02-15 ENCOUNTER — Ambulatory Visit (INDEPENDENT_AMBULATORY_CARE_PROVIDER_SITE_OTHER): Payer: 59 | Admitting: Family Medicine

## 2014-02-15 ENCOUNTER — Encounter: Payer: Self-pay | Admitting: Family Medicine

## 2014-02-15 VITALS — BP 122/82 | HR 61 | Temp 98.1°F | Ht 70.0 in | Wt 214.0 lb

## 2014-02-15 DIAGNOSIS — J069 Acute upper respiratory infection, unspecified: Secondary | ICD-10-CM

## 2014-02-15 MED ORDER — METHYLPREDNISOLONE ACETATE 80 MG/ML IJ SUSP
80.0000 mg | Freq: Once | INTRAMUSCULAR | Status: AC
  Administered 2014-02-15: 80 mg via INTRAMUSCULAR

## 2014-02-15 MED ORDER — AZITHROMYCIN 250 MG PO TABS: ORAL_TABLET | ORAL | Status: DC

## 2014-02-15 NOTE — Progress Notes (Signed)
   Subjective:    Patient ID: Austin Clark, male    DOB: 1950/09/10, 63 y.o.   MRN: [id number]  HPI C/o cough and uri sx's.  Review of Systems  Constitutional: Negative for fever.  HENT: Negative for ear pain.   Eyes: Negative for discharge.  Respiratory: Negative for cough.   Cardiovascular: Negative for chest pain.  Gastrointestinal: Negative for abdominal distention.  Endocrine: Negative for polyuria.  Genitourinary: Negative for difficulty urinating.  Musculoskeletal: Negative for gait problem and neck pain.  Skin: Negative for color change and rash.  Neurological: Negative for speech difficulty and headaches.  Psychiatric/Behavioral: Negative for agitation.       Objective:    BP 122/82 mmHg  Pulse 61  Temp(Src) 98.1 F (36.7 C) (Oral)  Ht 5\' 10"  (1.778 m)  Wt 214 lb (97.07 kg)  BMI 30.71 kg/m2 Physical Exam  Constitutional: He is oriented to person, place, and time. He appears well-developed and well-nourished.  HENT:  Head: Normocephalic and atraumatic.  Mouth/Throat: Oropharynx is clear and moist.  Eyes: Pupils are equal, round, and reactive to light.  Neck: Normal range of motion. Neck supple.  Cardiovascular: Normal rate and regular rhythm.   No murmur heard. Pulmonary/Chest: Effort normal and breath sounds normal.  Abdominal: Soft. Bowel sounds are normal. There is no tenderness.  Neurological: He is alert and oriented to person, place, and time.  Skin: Skin is warm and dry.  Psychiatric: He has a normal mood and affect.          Assessment & Plan:     ICD-9-CM ICD-10-CM   1. URI (upper respiratory infection) 465.9 J06.9 methylPREDNISolone acetate (DEPO-MEDROL) injection 80 mg     azithromycin (ZITHROMAX) 250 MG tablet   Push po fluids, rest, tylenol and motrin otc prn as directed for fever, arthralgias, and myalgias.  Follow up prn if sx's continue or persist.  No Follow-up on file.  Lysbeth Penner FNP

## 2014-05-08 ENCOUNTER — Telehealth: Payer: Self-pay | Admitting: Family Medicine

## 2014-05-08 NOTE — Telephone Encounter (Signed)
Patient given appointment 7/6 with Laurance Flatten.

## 2014-05-10 ENCOUNTER — Ambulatory Visit: Payer: 59 | Admitting: Family Medicine

## 2014-05-19 ENCOUNTER — Encounter: Payer: Self-pay | Admitting: Family

## 2014-05-19 ENCOUNTER — Ambulatory Visit (INDEPENDENT_AMBULATORY_CARE_PROVIDER_SITE_OTHER): Payer: 59 | Admitting: Family

## 2014-05-19 VITALS — BP 104/68 | HR 61 | Temp 97.2°F | Ht 70.0 in | Wt 217.2 lb

## 2014-05-19 DIAGNOSIS — J209 Acute bronchitis, unspecified: Secondary | ICD-10-CM | POA: Diagnosis not present

## 2014-05-19 MED ORDER — BENZONATATE 200 MG PO CAPS: 200.0000 mg | ORAL_CAPSULE | Freq: Three times a day (TID) | ORAL | Status: DC | PRN

## 2014-05-19 MED ORDER — METHYLPREDNISOLONE (PAK) 4 MG PO TABS: ORAL_TABLET | ORAL | Status: DC

## 2014-05-19 MED ORDER — LEVOFLOXACIN 500 MG PO TABS: 500.0000 mg | ORAL_TABLET | Freq: Every day | ORAL | Status: DC

## 2014-05-19 MED ORDER — HYDROCODONE-HOMATROPINE 5-1.5 MG/5ML PO SYRP: 5.0000 mL | ORAL_SOLUTION | Freq: Three times a day (TID) | ORAL | Status: DC | PRN

## 2014-05-19 NOTE — Patient Instructions (Addendum)
Acute Bronchitis Bronchitis is inflammation of the airways that extend from the windpipe into the lungs (bronchi). The inflammation often causes mucus to develop. This leads to a cough, which is the most common symptom of bronchitis.  In acute bronchitis, the condition usually develops suddenly and goes away over time, usually in a couple weeks. Smoking, allergies, and asthma can make bronchitis worse. Repeated episodes of bronchitis may cause further lung problems.  CAUSES Acute bronchitis is most often caused by the same virus that causes a cold. The virus can spread from person to person (contagious) through coughing, sneezing, and touching contaminated objects. SIGNS AND SYMPTOMS   Cough.   Fever.   Coughing up mucus.   Body aches.   Chest congestion.   Chills.   Shortness of breath.   Sore throat.  DIAGNOSIS  Acute bronchitis is usually diagnosed through a physical exam. Your health care provider will also ask you questions about your medical history. Tests, such as chest X-rays, are sometimes done to rule out other conditions.  TREATMENT  Acute bronchitis usually goes away in a couple weeks. Oftentimes, no medical treatment is necessary. Medicines are sometimes given for relief of fever or cough. Antibiotic medicines are usually not needed but may be prescribed in certain situations. In some cases, an inhaler may be recommended to help reduce shortness of breath and control the cough. A cool mist vaporizer may also be used to help thin bronchial secretions and make it easier to clear the chest.  HOME CARE INSTRUCTIONS  Get plenty of rest.   Drink enough fluids to keep your urine clear or pale yellow (unless you have a medical condition that requires fluid restriction). Increasing fluids may help thin your respiratory secretions (sputum) and reduce chest congestion, and it will prevent dehydration.   Take medicines only as directed by your health care provider.  If  you were prescribed an antibiotic medicine, finish it all even if you start to feel better.  Avoid smoking and secondhand smoke. Exposure to cigarette smoke or irritating chemicals will make bronchitis worse. If you are a smoker, consider using nicotine gum or skin patches to help control withdrawal symptoms. Quitting smoking will help your lungs heal faster.   Reduce the chances of another bout of acute bronchitis by washing your hands frequently, avoiding people with cold symptoms, and trying not to touch your hands to your mouth, nose, or eyes.   Keep all follow-up visits as directed by your health care provider.  SEEK MEDICAL CARE IF: Your symptoms do not improve after 1 week of treatment.  SEEK IMMEDIATE MEDICAL CARE IF:  You develop an increased fever or chills.   You have chest pain.   You have severe shortness of breath.  You have bloody sputum.   You develop dehydration.  You faint or repeatedly feel like you are going to pass out.  You develop repeated vomiting.  You develop a severe headache. MAKE SURE YOU:   Understand these instructions.  Will watch your condition.  Will get help right away if you are not doing well or get worse. Document Released: 03/12/2004 Document Revised: 06/19/2013 Document Reviewed: 07/26/2012 Exit[employer] Patient Information 2015 [employer]. This information is not intended to replace advice given to you by your health care provider. Make sure you discuss any questions you have with your health care provider.  - Take meds as prescribed - Use a cool mist humidifier  -Use saline nose sprays frequently -Saline irrigations of the nose can   be very helpful if done frequently.  * 4X daily for 1 week*  * Use of a nettie pot can be helpful with this. Follow directions with this* -Force fluids -For any cough or congestion  Use plain Mucinex- regular strength or max strength is fine   * Children- consult with Pharmacist for dosing -For  fever or aces or pains- take tylenol or ibuprofen appropriate for age and weight.  * for fevers greater than 101 orally you may alternate ibuprofen and tylenol every  3 hours. -Throat lozenges if help    Christy Hawks, FNP  

## 2014-05-19 NOTE — Progress Notes (Signed)
Subjective:    Patient ID: Austin Clark, male    DOB: 1950/09/09, 64 y.o.   MRN: [id number]  Cough This is a new problem. The current episode started 1 to 4 weeks ago. The problem has been gradually worsening. The problem occurs every few minutes. The cough is productive of purulent sputum. Associated symptoms include ear congestion, headaches, nasal congestion, postnasal drip and rhinorrhea. Pertinent negatives include no chills, ear pain, fever, myalgias, sore throat, shortness of breath or wheezing. The symptoms are aggravated by lying down and pollens. He has tried rest (Zyrtec, mucinex) for the symptoms. The treatment provided mild relief. There is no history of asthma or COPD.      Review of Systems  Constitutional: Negative.  Negative for fever and chills.  HENT: Positive for postnasal drip and rhinorrhea. Negative for ear pain and sore throat.   Respiratory: Positive for cough. Negative for shortness of breath and wheezing.   Cardiovascular: Negative.   Gastrointestinal: Negative.   Endocrine: Negative.   Genitourinary: Negative.   Musculoskeletal: Negative.  Negative for myalgias.  Neurological: Positive for headaches.  Hematological: Negative.   Psychiatric/Behavioral: Negative.   All other systems reviewed and are negative.      Objective:   Physical Exam  Constitutional: He is oriented to person, place, and time. He appears well-developed and well-nourished. No distress.  HENT:  Head: Normocephalic.  Right Ear: External ear normal.  Left Ear: External ear normal.  Nasal passage erythemas with mild swelling  Oropharynx erythemas   Eyes: Pupils are equal, round, and reactive to light. Right eye exhibits no discharge. Left eye exhibits no discharge.  Neck: Normal range of motion. Neck supple. No thyromegaly present.  Cardiovascular: Normal rate, regular rhythm, normal heart sounds and intact distal pulses.   No murmur heard. Pulmonary/Chest: Effort normal. No  respiratory distress. He has wheezes.  Abdominal: Soft. Bowel sounds are normal. He exhibits no distension. There is no tenderness.  Musculoskeletal: Normal range of motion. He exhibits no edema or tenderness.  Neurological: He is alert and oriented to person, place, and time. He has normal reflexes. No cranial nerve deficit.  Skin: Skin is warm and dry. No rash noted. No erythema.  Psychiatric: He has a normal mood and affect. His behavior is normal. Judgment and thought content normal.  Vitals reviewed.     BP 104/68 mmHg  Pulse 61  Temp(Src) 97.2 F (36.2 C) (Oral)  Ht 5\' 10"  (1.778 m)  Wt 217 lb 3.2 oz (98.521 kg)  BMI 31.16 kg/m2     Assessment & Plan:  1. Acute bronchitis, unspecified organism -Continue with daily zyrtec  - Take meds as prescribed - Use a cool mist humidifier  -Use saline nose sprays frequently -Saline irrigations of the nose can be very helpful if done frequently.  * 4X daily for 1 week*  * Use of a nettie pot can be helpful with this. Follow directions with this* -Force fluids -For any cough or congestion  Use plain Mucinex- regular strength or max strength is fine   * Children- consult with Pharmacist for dosing -For fever or aces or pains- take tylenol or ibuprofen appropriate for age and weight.  * for fevers greater than 101 orally you may alternate ibuprofen and tylenol every  3 hours. -Throat lozenges if help - benzonatate (TESSALON) 200 MG capsule; Take 1 capsule (200 mg total) by mouth 3 (three) times daily as needed.  Dispense: 30 capsule; Refill: 1 - HYDROcodone-homatropine (HYCODAN) 5-1.5 MG/5ML syrup;  Take 5 mLs by mouth every 8 (eight) hours as needed for cough.  Dispense: 120 mL; Refill: 0 - methylPREDNIsolone (MEDROL DOSPACK) 4 MG tablet; follow package directions  Dispense: 21 tablet; Refill: 0 - levofloxacin (LEVAQUIN) 500 MG tablet; Take 1 tablet (500 mg total) by mouth daily.  Dispense: 7 tablet; Refill: 0  Evelina Dun, FNP

## 2014-08-16 ENCOUNTER — Other Ambulatory Visit (INDEPENDENT_AMBULATORY_CARE_PROVIDER_SITE_OTHER): Payer: BLUE CROSS/BLUE SHIELD

## 2014-08-16 DIAGNOSIS — N400 Enlarged prostate without lower urinary tract symptoms: Secondary | ICD-10-CM | POA: Diagnosis not present

## 2014-08-16 DIAGNOSIS — E349 Endocrine disorder, unspecified: Secondary | ICD-10-CM

## 2014-08-16 DIAGNOSIS — E559 Vitamin D deficiency, unspecified: Secondary | ICD-10-CM

## 2014-08-16 DIAGNOSIS — Z8639 Personal history of other endocrine, nutritional and metabolic disease: Secondary | ICD-10-CM | POA: Diagnosis not present

## 2014-08-16 DIAGNOSIS — E291 Testicular hypofunction: Secondary | ICD-10-CM | POA: Diagnosis not present

## 2014-08-16 DIAGNOSIS — E785 Hyperlipidemia, unspecified: Secondary | ICD-10-CM | POA: Diagnosis not present

## 2014-08-16 LAB — POCT CBC
Granulocyte percent: 61.9 %G (ref 37–80)
HCT, POC: 43.8 % (ref 43.5–53.7)
Hemoglobin: 14.1 g/dL (ref 14.1–18.1)
Lymph, poc: 2.4 (ref 0.6–3.4)
MCH, POC: 32 pg — AB (ref 27–31.2)
MCHC: 32.3 g/dL (ref 31.8–35.4)
MCV: 99.2 fL — AB (ref 80–97)
MPV: 9.8 fL (ref 0–99.8)
POC Granulocyte: 4.5 (ref 2–6.9)
POC LYMPH %: 32.3 % (ref 10–50)
Platelet Count, POC: 193 10*3/uL (ref 142–424)
RBC: 4.42 M/uL — AB (ref 4.69–6.13)
RDW, POC: 14.1 %
WBC: 7.3 10*3/uL (ref 4.6–10.2)

## 2014-08-16 NOTE — Progress Notes (Signed)
Lab only 

## 2014-08-17 LAB — BMP8+EGFR
BUN/Creatinine Ratio: 15 (ref 10–22)
BUN: 14 mg/dL (ref 8–27)
CHLORIDE: 106 mmol/L (ref 97–108)
CO2: 23 mmol/L (ref 18–29)
CREATININE: 0.94 mg/dL (ref 0.76–1.27)
Calcium: 9 mg/dL (ref 8.6–10.2)
GFR, EST AFRICAN AMERICAN: 100 mL/min/{1.73_m2} (ref >59)
GFR, EST NON AFRICAN AMERICAN: 87 mL/min/{1.73_m2} (ref >59)
GLUCOSE: 97 mg/dL (ref 65–99)
POTASSIUM: 4.8 mmol/L (ref 3.5–5.2)
Sodium: 145 mmol/L — ABNORMAL HIGH (ref 134–144)

## 2014-08-17 LAB — HEPATIC FUNCTION PANEL
ALT: 15 IU/L (ref 0–44)
AST: 14 IU/L (ref 0–40)
Albumin: 3.9 g/dL (ref 3.6–4.8)
Alkaline Phosphatase: 65 IU/L (ref 39–117)
BILIRUBIN, DIRECT: 0.06 mg/dL (ref 0.00–0.40)
Bilirubin Total: <0.2 mg/dL (ref 0.0–1.2)
Total Protein: 5.9 g/dL — ABNORMAL LOW (ref 6.0–8.5)

## 2014-08-17 LAB — NMR, LIPOPROFILE
Cholesterol: 207 mg/dL — ABNORMAL HIGH (ref 100–199)
HDL Cholesterol by NMR: 22 mg/dL — ABNORMAL LOW (ref >39)
HDL Particle Number: 24 umol/L — ABNORMAL LOW (ref >=30.5)
LDL Particle Number: 2124 nmol/L — ABNORMAL HIGH (ref <1000)
LDL Size: 19.7 nm (ref >20.5)
LDL-C: 139 mg/dL — ABNORMAL HIGH (ref 0–99)
LP-IR SCORE: 60 — AB (ref <=45)
Small LDL Particle Number: 1696 nmol/L — ABNORMAL HIGH (ref <=527)
Triglycerides by NMR: 232 mg/dL — ABNORMAL HIGH (ref 0–149)

## 2014-08-17 LAB — VITAMIN D 25 HYDROXY (VIT D DEFICIENCY, FRACTURES): Vit D, 25-Hydroxy: 24.9 ng/mL — ABNORMAL LOW (ref 30.0–100.0)

## 2014-08-17 LAB — THYROID PANEL WITH TSH
Free Thyroxine Index: 2.3 (ref 1.2–4.9)
T3 Uptake Ratio: 27 % (ref 24–39)
T4 TOTAL: 8.5 ug/dL (ref 4.5–12.0)
TSH: 4.51 u[IU]/mL — ABNORMAL HIGH (ref 0.450–4.500)

## 2014-08-22 ENCOUNTER — Ambulatory Visit (INDEPENDENT_AMBULATORY_CARE_PROVIDER_SITE_OTHER): Payer: BLUE CROSS/BLUE SHIELD | Admitting: Family Medicine

## 2014-08-22 ENCOUNTER — Other Ambulatory Visit: Payer: Self-pay | Admitting: *Deleted

## 2014-08-22 ENCOUNTER — Encounter: Payer: Self-pay | Admitting: Family Medicine

## 2014-08-22 VITALS — BP 107/56 | HR 65 | Temp 98.0°F | Ht 70.0 in | Wt 217.0 lb

## 2014-08-22 DIAGNOSIS — N400 Enlarged prostate without lower urinary tract symptoms: Secondary | ICD-10-CM

## 2014-08-22 DIAGNOSIS — Z23 Encounter for immunization: Secondary | ICD-10-CM

## 2014-08-22 DIAGNOSIS — E785 Hyperlipidemia, unspecified: Secondary | ICD-10-CM

## 2014-08-22 DIAGNOSIS — Z0000 Encounter for general adult medical examination without abnormal findings: Secondary | ICD-10-CM

## 2014-08-22 DIAGNOSIS — E559 Vitamin D deficiency, unspecified: Secondary | ICD-10-CM

## 2014-08-22 MED ORDER — ROSUVASTATIN CALCIUM 40 MG PO TABS: ORAL_TABLET | ORAL | Status: DC

## 2014-08-22 MED ORDER — VITAMIN D (ERGOCALCIFEROL) 1.25 MG (50000 UNIT) PO CAPS: 50000.0000 [IU] | ORAL_CAPSULE | ORAL | Status: DC

## 2014-08-22 NOTE — Progress Notes (Signed)
Austin Clark, Dr Laurance Flatten wants your insight on the BEST OTC smoking cessation drugs.  Please let me know and ill call the pt.

## 2014-08-22 NOTE — Progress Notes (Signed)
Subjective:    Patient ID: Austin Clark, male    DOB: 1950-10-14, 64 y.o.   MRN: [id number]  HPI Patient is here today for annual wellness exam and follow up of chronic medical problems which includes hyperlipidemia. He has not taken his medications regularly for the last 6 mos. the patient today complains of some arthralgias in his right shoulder and numbness. He denies chest pain shortness of breath or GI symptoms or problems passing his water. He has had blood work done recently and it must be noted again that he has not taken any medication for the past 6 months. His cholesterol numbers are extremely elevated and this includes the LDL particle number, the LDL C, and the triglycerides. CBC was within normal limits. The blood sugar and electrolytes were within normal limits. All liver function tests were within normal limits. The thyroid profile had a slight elevation of the TSH. The vitamin D was very low. All of this was reviewed with the patient at the initial part of the visit.      Patient Active Problem List   Diagnosis Date Noted  . Family history of colon cancer 11/09/2013  . Hyperlipidemia 05/31/2012  . BPH (benign prostatic hyperplasia) 05/31/2012  . History of hypothyroidism 05/31/2012  . Testosterone deficiency 05/31/2012  . Vitamin D deficiency 05/31/2012  . Fatigue 05/31/2012   Outpatient Encounter Prescriptions as of 08/22/2014  Medication Sig  . Latanoprost (XALATAN OP) Apply to eye daily.    Marland Kitchen aspirin (LONGS ADULT LOW STRENGTH ASA) 81 MG EC tablet Take 81 mg by mouth daily.    . niacin (NIASPAN) 500 MG CR tablet TAKE 2 TABLETS AT BEDTIME (Patient not taking: Reported on 08/22/2014)  . omega-3 acid ethyl esters (LOVAZA) 1 G capsule TAKE 2 CAPSULE BY MOUTH TWICE A DAY AS DIRECTED (Patient not taking: Reported on 08/22/2014)  . rosuvastatin (CRESTOR) 40 MG tablet TAKE 1 TABLET EVERY DAY (Patient not taking: Reported on 08/22/2014)  . [DISCONTINUED] benzonatate (TESSALON) 200 MG  capsule Take 1 capsule (200 mg total) by mouth 3 (three) times daily as needed.  . [DISCONTINUED] HYDROcodone-homatropine (HYCODAN) 5-1.5 MG/5ML syrup Take 5 mLs by mouth every 8 (eight) hours as needed for cough.  . [DISCONTINUED] levofloxacin (LEVAQUIN) 500 MG tablet Take 1 tablet (500 mg total) by mouth daily.  . [DISCONTINUED] methylPREDNIsolone (MEDROL DOSPACK) 4 MG tablet follow package directions   No facility-administered encounter medications on file as of 08/22/2014.      Review of Systems  Constitutional: Negative.   HENT: Negative.   Eyes: Negative.   Respiratory: Negative.   Cardiovascular: Negative.   Gastrointestinal: Negative.   Endocrine: Negative.   Genitourinary: Negative.   Musculoskeletal: Positive for arthralgias (right shoudler pain and some right arm numbness).  Skin: Negative.   Allergic/Immunologic: Negative.   Neurological: Negative.   Hematological: Negative.   Psychiatric/Behavioral: Negative.        Objective:   Physical Exam  Constitutional: He is oriented to person, place, and time. He appears well-developed and well-nourished. No distress.  HENT:  Head: Normocephalic and atraumatic.  Right Ear: External ear normal.  Left Ear: External ear normal.  Mouth/Throat: Oropharynx is clear and moist. No oropharyngeal exudate.  Nasal congestion bilaterally  Eyes: Conjunctivae and EOM are normal. Pupils are equal, round, and reactive to light. Right eye exhibits no discharge. Left eye exhibits no discharge. No scleral icterus.  Neck: Normal range of motion. Neck supple. No thyromegaly present.  No bruits or anterior cervical  adenopathy or thyromegaly  Cardiovascular: Normal rate, regular rhythm, normal heart sounds and intact distal pulses.   No murmur heard. The heart was slightly irregular at 72/m this was noted in the sitting position and was not present in the supine position.  Pulmonary/Chest: Effort normal and breath sounds normal. No respiratory  distress. He has no wheezes. He has no rales. He exhibits no tenderness.  Clear anteriorly and posteriorly  Abdominal: Soft. Bowel sounds are normal. He exhibits no mass. There is no tenderness. There is no rebound and no guarding.  The abdomen is nontender without masses or bruits  Musculoskeletal: Normal range of motion. He exhibits no edema or tenderness.  I was unable to elicit any tenderness around the right shoulder blade and the patient appeared to have full range of motion without pain and he indicated that this discomfort seemed to be getting better. He had good radial pulses and reflexes were normal bilaterally.  Lymphadenopathy:    He has no cervical adenopathy.  Neurological: He is alert and oriented to person, place, and time. He has normal reflexes. No cranial nerve deficit.  Skin: Skin is warm and dry. No rash noted. No erythema. No pallor.  Psychiatric: He has a normal mood and affect. His behavior is normal. Judgment and thought content normal.  Nursing note and vitals reviewed.  BP 107/56 mmHg  Pulse 65  Temp(Src) 98 F (36.7 C) (Oral)  Ht 5\' 10"  (1.778 m)  Wt 217 lb (98.431 kg)  BMI 31.14 kg/m2        Assessment & Plan:  1. BPH (benign prostatic hyperplasia) -The patient is having no symptoms with his BPH and this was not examined today because it is been less than a year since it was examined.  2. Hyperlipidemia -He had stopped his cholesterol medicine for no reason and his cholesterol numbers were much higher than in the past. He plans to restart his medicine 20 mg daily. He should get a traditional lipid liver panel in about 3 months along with a vitamin D level  3. Annual physical exam -This will be deferred until early January of next year  4. Vitamin D deficiency -The patient will be started on vitamin D 50,000 units 1 weekly and will have a recheck the vitamin D level in about 3 months when he gets a traditional lipid liver panel  The patient is also in  need of a Prevnar vaccine which he says his insurance will allow him to take and we will give this to him today. We will give him a new prescription for Crestor 20 mg along with a coupon to reduce the cost and we will also give him a prescription for vitamin D 50,000 units 1 weekly #12  Patient Instructions  Continue current medications. Continue good therapeutic lifestyle changes which include good diet and exercise. Fall precautions discussed with patient. If an FOBT was given today- please return it to our front desk. If you are over 22 years old - you may need Prevnar 36 or the adult Pneumonia vaccine.  Flu Shots are still available at our office. If you still haven't had one please call to set up a nurse visit to get one.   After your visit with [place] today you will receive a survey in the mail or online from [employer] regarding your care with [place]. Please take a moment to fill this out. Your feedback is very important to [place] as you can help [place] better understand your patient  needs as well as improve your experience and satisfaction. WE CARE ABOUT YOU!!!   The patient should restart his Crestor 20 mg and take this regularly in addition to trying to walk and exercise more and following his diet If the patient continues to have tingling in his arm or right shoulder pain he should take Zantac 150 before breakfast and supper for 10-14 days and with taking this try Aleve 1 after breakfast and supper while protecting his stoma with Zantac If the arm numbness or the shoulder pain continues he should return to the office and we will pursue further studies with x-ray He should use warm wet compresses to the right shoulder area and right shoulder blade area and avoid heavy lifting pushing pulling and straining He should start vitamin D 50,000 units 1 weekly He should return to the office for a traditional lipid panel liver function test and vitamin D level in about 3 months and return for a physical exam in  6 months with a rectal exam and PSA He needs to stop smoking 2 packs a week is too much.   Arrie Senate MD

## 2014-08-22 NOTE — Progress Notes (Signed)
It really depends on patient preference.  Nicotine patches will give more even and sustained nicotine replacement.  Chewing gum and lonzeges will get as needed relief (when he is having a craving) and can be used with the patches if patient wants.   Hope this helps.  The most successful are patients that are ready to quit.  He can also call [facility] Quit line for assistance when having cravings - 1-800-QUIT-NOW.

## 2014-08-22 NOTE — Progress Notes (Signed)
lmtcb

## 2014-08-22 NOTE — Patient Instructions (Addendum)
Continue current medications. Continue good therapeutic lifestyle changes which include good diet and exercise. Fall precautions discussed with patient. If an FOBT was given today- please return it to our front desk. If you are over 63 years old - you may need Prevnar 21 or the adult Pneumonia vaccine.  Flu Shots are still available at our office. If you still haven't had one please call to set up a nurse visit to get one.   After your visit with [place] today you will receive a survey in the mail or online from [employer] regarding your care with [place]. Please take a moment to fill this out. Your feedback is very important to [place] as you can help [place] better understand your patient needs as well as improve your experience and satisfaction. WE CARE ABOUT YOU!!!   The patient should restart his Crestor 20 mg and take this regularly in addition to trying to walk and exercise more and following his diet If the patient continues to have tingling in his arm or right shoulder pain he should take Zantac 150 before breakfast and supper for 10-14 days and with taking this try Aleve 1 after breakfast and supper while protecting his stoma with Zantac If the arm numbness or the shoulder pain continues he should return to the office and we will pursue further studies with x-ray He should use warm wet compresses to the right shoulder area and right shoulder blade area and avoid heavy lifting pushing pulling and straining He should start vitamin D 50,000 units 1 weekly He should return to the office for a traditional lipid panel liver function test and vitamin D level in about 3 months and return for a physical exam in 6 months with a rectal exam and PSA He needs to stop smoking 2 packs a week is too much.

## 2014-08-28 ENCOUNTER — Telehealth: Payer: Self-pay | Admitting: Family Medicine

## 2014-08-28 ENCOUNTER — Ambulatory Visit: Payer: BLUE CROSS/BLUE SHIELD

## 2014-08-28 NOTE — Telephone Encounter (Signed)
Time made for this afternoon for Triage nurse to look at

## 2014-11-26 ENCOUNTER — Ambulatory Visit (INDEPENDENT_AMBULATORY_CARE_PROVIDER_SITE_OTHER): Payer: BLUE CROSS/BLUE SHIELD | Admitting: *Deleted

## 2014-11-26 DIAGNOSIS — Z23 Encounter for immunization: Secondary | ICD-10-CM | POA: Diagnosis not present

## 2015-03-18 ENCOUNTER — Ambulatory Visit: Payer: BLUE CROSS/BLUE SHIELD | Admitting: Family Medicine

## 2015-07-17 ENCOUNTER — Other Ambulatory Visit: Payer: BLUE CROSS/BLUE SHIELD

## 2015-07-17 DIAGNOSIS — Z0000 Encounter for general adult medical examination without abnormal findings: Secondary | ICD-10-CM

## 2015-07-17 DIAGNOSIS — N400 Enlarged prostate without lower urinary tract symptoms: Secondary | ICD-10-CM

## 2015-07-17 DIAGNOSIS — E559 Vitamin D deficiency, unspecified: Secondary | ICD-10-CM

## 2015-07-17 DIAGNOSIS — E785 Hyperlipidemia, unspecified: Secondary | ICD-10-CM

## 2015-07-18 ENCOUNTER — Ambulatory Visit: Payer: BLUE CROSS/BLUE SHIELD | Admitting: Family Medicine

## 2015-07-18 LAB — BMP8+EGFR
BUN / CREAT RATIO: 13 (ref 10–24)
BUN: 13 mg/dL (ref 8–27)
CHLORIDE: 105 mmol/L (ref 96–106)
CO2: 22 mmol/L (ref 18–29)
CREATININE: 0.98 mg/dL (ref 0.76–1.27)
Calcium: 9 mg/dL (ref 8.6–10.2)
GFR, EST AFRICAN AMERICAN: 94 mL/min/{1.73_m2} (ref >59)
GFR, EST NON AFRICAN AMERICAN: 82 mL/min/{1.73_m2} (ref >59)
GLUCOSE: 95 mg/dL (ref 65–99)
Potassium: 4.3 mmol/L (ref 3.5–5.2)
Sodium: 144 mmol/L (ref 134–144)

## 2015-07-18 LAB — CBC WITH DIFFERENTIAL/PLATELET
BASOS ABS: 0 10*3/uL (ref 0.0–0.2)
Basos: 1 %
EOS (ABSOLUTE): 0.1 10*3/uL (ref 0.0–0.4)
EOS: 2 %
HEMATOCRIT: 40.1 % (ref 37.5–51.0)
HEMOGLOBIN: 13.5 g/dL (ref 12.6–17.7)
IMMATURE GRANS (ABS): 0 10*3/uL (ref 0.0–0.1)
Immature Granulocytes: 0 %
LYMPHS ABS: 1.7 10*3/uL (ref 0.7–3.1)
LYMPHS: 26 %
MCH: 33.1 pg — AB (ref 26.6–33.0)
MCHC: 33.7 g/dL (ref 31.5–35.7)
MCV: 98 fL — ABNORMAL HIGH (ref 79–97)
MONOCYTES: 7 %
Monocytes Absolute: 0.5 10*3/uL (ref 0.1–0.9)
NEUTROS ABS: 4.2 10*3/uL (ref 1.4–7.0)
Neutrophils: 64 %
Platelets: 195 10*3/uL (ref 150–379)
RBC: 4.08 x10E6/uL — AB (ref 4.14–5.80)
RDW: 13.3 % (ref 12.3–15.4)
WBC: 6.5 10*3/uL (ref 3.4–10.8)

## 2015-07-18 LAB — HEPATIC FUNCTION PANEL
ALBUMIN: 4.1 g/dL (ref 3.6–4.8)
ALK PHOS: 71 IU/L (ref 39–117)
ALT: 18 IU/L (ref 0–44)
AST: 19 IU/L (ref 0–40)
BILIRUBIN, DIRECT: 0.07 mg/dL (ref 0.00–0.40)
Bilirubin Total: 0.3 mg/dL (ref 0.0–1.2)
TOTAL PROTEIN: 6.5 g/dL (ref 6.0–8.5)

## 2015-07-18 LAB — NMR, LIPOPROFILE
CHOLESTEROL: 195 mg/dL (ref 100–199)
HDL Cholesterol by NMR: 28 mg/dL — ABNORMAL LOW (ref >39)
HDL PARTICLE NUMBER: 23.1 umol/L — AB (ref >=30.5)
LDL Particle Number: 2630 nmol/L — ABNORMAL HIGH (ref <1000)
LDL SIZE: 19.6 nm (ref >20.5)
LDL-C: 143 mg/dL — ABNORMAL HIGH (ref 0–99)
LP-IR SCORE: 75 — AB (ref <=45)
Small LDL Particle Number: 2220 nmol/L — ABNORMAL HIGH (ref <=527)
Triglycerides by NMR: 118 mg/dL (ref 0–149)

## 2015-07-18 LAB — VITAMIN D 25 HYDROXY (VIT D DEFICIENCY, FRACTURES): VIT D 25 HYDROXY: 35.8 ng/mL (ref 30.0–100.0)

## 2015-07-24 ENCOUNTER — Ambulatory Visit (INDEPENDENT_AMBULATORY_CARE_PROVIDER_SITE_OTHER): Payer: BLUE CROSS/BLUE SHIELD

## 2015-07-24 ENCOUNTER — Ambulatory Visit (INDEPENDENT_AMBULATORY_CARE_PROVIDER_SITE_OTHER): Payer: BLUE CROSS/BLUE SHIELD | Admitting: Family Medicine

## 2015-07-24 ENCOUNTER — Ambulatory Visit: Payer: BLUE CROSS/BLUE SHIELD

## 2015-07-24 ENCOUNTER — Encounter: Payer: Self-pay | Admitting: Family Medicine

## 2015-07-24 VITALS — BP 128/69 | HR 59 | Temp 97.4°F | Ht 70.0 in | Wt 216.0 lb

## 2015-07-24 DIAGNOSIS — E559 Vitamin D deficiency, unspecified: Secondary | ICD-10-CM

## 2015-07-24 DIAGNOSIS — Z800 Family history of malignant neoplasm of digestive organs: Secondary | ICD-10-CM

## 2015-07-24 DIAGNOSIS — M25551 Pain in right hip: Secondary | ICD-10-CM

## 2015-07-24 DIAGNOSIS — M25552 Pain in left hip: Secondary | ICD-10-CM

## 2015-07-24 DIAGNOSIS — I7090 Unspecified atherosclerosis: Secondary | ICD-10-CM

## 2015-07-24 DIAGNOSIS — N400 Enlarged prostate without lower urinary tract symptoms: Secondary | ICD-10-CM

## 2015-07-24 DIAGNOSIS — E785 Hyperlipidemia, unspecified: Secondary | ICD-10-CM

## 2015-07-24 DIAGNOSIS — Z0000 Encounter for general adult medical examination without abnormal findings: Secondary | ICD-10-CM | POA: Diagnosis not present

## 2015-07-24 DIAGNOSIS — R972 Elevated prostate specific antigen [PSA]: Secondary | ICD-10-CM

## 2015-07-24 LAB — URINALYSIS, COMPLETE
Bilirubin, UA: NEGATIVE
Glucose, UA: NEGATIVE
Ketones, UA: NEGATIVE
LEUKOCYTES UA: NEGATIVE
Nitrite, UA: NEGATIVE
PH UA: 6.5 (ref 5.0–7.5)
PROTEIN UA: NEGATIVE
RBC UA: NEGATIVE
Specific Gravity, UA: 1.02 (ref 1.005–1.030)
Urobilinogen, Ur: 0.2 mg/dL (ref 0.2–1.0)

## 2015-07-24 LAB — MICROSCOPIC EXAMINATION
BACTERIA UA: NONE SEEN
EPITHELIAL CELLS (NON RENAL): NONE SEEN /HPF (ref 0–10)
RBC, UA: NONE SEEN /hpf (ref 0–?)

## 2015-07-24 MED ORDER — NIACIN ER (ANTIHYPERLIPIDEMIC) 500 MG PO TBCR: EXTENDED_RELEASE_TABLET | ORAL | Status: DC

## 2015-07-24 MED ORDER — VITAMIN D (ERGOCALCIFEROL) 1.25 MG (50000 UNIT) PO CAPS: 50000.0000 [IU] | ORAL_CAPSULE | ORAL | Status: DC

## 2015-07-24 MED ORDER — ROSUVASTATIN CALCIUM 40 MG PO TABS: ORAL_TABLET | ORAL | Status: DC

## 2015-07-24 MED ORDER — OMEGA-3-ACID ETHYL ESTERS 1 G PO CAPS: ORAL_CAPSULE | ORAL | Status: DC

## 2015-07-24 NOTE — Progress Notes (Addendum)
Subjective:    Patient ID: Austin Clark, male    DOB: 02/22/1950, 65 y.o.   MRN: [id number]  HPI Patient is here today for annual wellness exam and follow up of chronic medical problems which includes hyperlipidemia. He is not taking medications regularly. He has been out of his medications for several months. This most likely will explain why his cholesterol numbers are so high. He is also complaining with some bilateral hip pain. He is requesting refills on all his medicines. He is due to have a rectal exam today and will be given an FOBT to return. A PSA was not done on the blood work so we will get one done today for his test for prostate cancer. He will also get an EKG. Patient denies chest pain or chest tightness or palpitations. He does not get a lot of physical activity other than working in a jar and when he is playing golf although he rides a golf cart. He denies any chest pain or shortness of breath. He denies any problems with heartburn or indigestion unless he eats spicy food. He denies any signs of blood in the stool or black tarry bowel movements. He is passing his water without problems. He continues to have erectile dysfunction issues. He is seen the urologist as all of the agents like Viagra did not help. He says right now that he is not interested in this. He is having problems with both hips and this is especially bad over the past couple weeks. His nose problems with her hips for several months especially when he flexes his knee to put his socks on. He went walking at the beach for a good distance and had severe pain with walking in both hips with the right hurting worse than the left. He has no other history of any other injury.    Patient Active Problem List   Diagnosis Date Noted  . Family history of colon cancer 11/09/2013  . Hyperlipidemia 05/31/2012  . BPH (benign prostatic hyperplasia) 05/31/2012  . History of hypothyroidism 05/31/2012  . Testosterone deficiency 05/31/2012   . Vitamin D deficiency 05/31/2012  . Fatigue 05/31/2012   Outpatient Encounter Prescriptions as of 07/24/2015  Medication Sig  . aspirin (LONGS ADULT LOW STRENGTH ASA) 81 MG EC tablet Take 81 mg by mouth daily.    . Latanoprost (XALATAN OP) Apply to eye daily.    . niacin (NIASPAN) 500 MG CR tablet TAKE 2 TABLETS AT BEDTIME  . omega-3 acid ethyl esters (LOVAZA) 1 G capsule TAKE 2 CAPSULE BY MOUTH TWICE A DAY AS DIRECTED  . rosuvastatin (CRESTOR) 40 MG tablet TAKE 1 TABLET EVERY DAY  . Vitamin D, Ergocalciferol, (DRISDOL) 50000 UNITS CAPS capsule Take 1 capsule (50,000 Units total) by mouth every 7 (seven) days.   No facility-administered encounter medications on file as of 07/24/2015.      Review of Systems  Constitutional: Negative.   HENT: Negative.   Eyes: Negative.   Respiratory: Negative.   Cardiovascular: Negative.   Gastrointestinal: Negative.   Endocrine: Negative.   Genitourinary: Negative.   Musculoskeletal: Positive for arthralgias (bilateral hip soreness with excertion ).  Skin: Negative.   Allergic/Immunologic: Negative.   Neurological: Negative.   Hematological: Negative.   Psychiatric/Behavioral: Negative.        Objective:   Physical Exam  Constitutional: He is oriented to person, place, and time. He appears well-developed and well-nourished. No distress.  HENT:  Head: Normocephalic and atraumatic.  Right Ear: External ear  normal.  Left Ear: External ear normal.  Mouth/Throat: Oropharynx is clear and moist. No oropharyngeal exudate.  Nasal congestion and turbinate swelling bilaterally.  Eyes: Conjunctivae and EOM are normal. Pupils are equal, round, and reactive to light. Right eye exhibits no discharge. Left eye exhibits no discharge. No scleral icterus.  Patient's eye exam is up-to-date.  Neck: Normal range of motion. Neck supple. No thyromegaly present.  No bruits or thyromegaly.  Cardiovascular: Normal rate, regular rhythm, normal heart sounds and  intact distal pulses.   No murmur heard. The heart had a regular rate and rhythm at 60/m.  Pulmonary/Chest: Effort normal and breath sounds normal. No respiratory distress. He has no wheezes. He has no rales. He exhibits no tenderness.  The axillary area was clear bilaterally of adenopathy. The lungs were clear anteriorly and posteriorly. There were no chest wall masses.  Abdominal: Soft. Bowel sounds are normal. He exhibits no mass. There is no tenderness. There is no rebound and no guarding.  No liver or spleen enlargement no masses and no bruits. No inguinal adenopathy.  Genitourinary: Rectum normal and penis normal.  The prostate was enlarged but soft and smooth. There are no lumps or masses. There were no rectal masses. Urgent a were within normal limits and there were no inguinal hernias palpable.  Musculoskeletal: Normal range of motion. He exhibits no edema.  Lymphadenopathy:    He has no cervical adenopathy.  Neurological: He is alert and oriented to person, place, and time. He has normal reflexes. No cranial nerve deficit.  Skin: Skin is warm and dry. No rash noted.  Psychiatric: He has a normal mood and affect. His behavior is normal. Judgment and thought content normal.  Nursing note and vitals reviewed.   BP 128/69 mmHg  Pulse 59  Temp(Src) 97.4 F (36.3 C) (Oral)  Ht 5\' 10"  (1.778 m)  Wt 216 lb (97.977 kg)  BMI 30.99 kg/m2  [facility] reading (PRIMARY) by  Dr. Charlesetta Garibaldi hips and LS spine with results pending                                       Assessment & Plan:  1. Hyperlipidemia -Restart cholesterol medicine and take this regularly and following aggressive therapeutic lifestyle changes - EKG 12-Lead  2. BPH (benign prostatic hyperplasia) -The prostate is enlarged but soft and smooth the patient is not having any trouble passing his water. - PSA, total and free - Urinalysis, Complete  3. Annual physical exam -The patient is due for his colonoscopy in  October or November of this year. There is a positive family history for colon cancer. - PSA, total and free - Urinalysis, Complete - EKG 12-Lead  4. Vitamin D deficiency -Restart vitamin D3 1000 units 1 daily  5. Family history of colon cancer -The patient is due to his colonoscopy this year.  6. Bilateral hip pain -No treatment until results of x-rays are back - DG Lumbar Spine 2-3 Views; Future - DG HIP UNILAT W OR W/O PELVIS 2-3 VIEWS LEFT; Future - DG HIP UNILAT W OR W/O PELVIS 2-3 VIEWS RIGHT; Future  Meds ordered this encounter  Medications  . niacin (NIASPAN) 500 MG CR tablet    Sig: TAKE 2 TABLETS AT BEDTIME    Dispense:  180 tablet    Refill:  3  . omega-3 acid ethyl esters (LOVAZA) 1 g capsule    Sig: TAKE  2 CAPSULE BY MOUTH TWICE A DAY AS DIRECTED    Dispense:  120 capsule    Refill:  3  . rosuvastatin (CRESTOR) 40 MG tablet    Sig: TAKE 1 TABLET EVERY DAY    Dispense:  90 tablet    Refill:  3  . Vitamin D, Ergocalciferol, (DRISDOL) 50000 units CAPS capsule    Sig: Take 1 capsule (50,000 Units total) by mouth every 7 (seven) days.    Dispense:  12 capsule    Refill:  3   Patient Instructions  Continue current medications. Continue good therapeutic lifestyle changes which include good diet and exercise. Fall precautions discussed with patient. If an FOBT was given today- please return it to our front desk. If you are over 39 years old - you may need Prevnar 46 or the adult Pneumonia vaccine.   After your visit with [place] today you will receive a survey in the mail or online from [employer] regarding your care with [place]. Please take a moment to fill this out. Your feedback is very important to [place] as you can help [place] better understand your patient needs as well as improve your experience and satisfaction. WE CARE ABOUT YOU!!!   The patient must take his medications regularly. Take vitamin D 3 1000 units, 1 daily over-the-counter. I prefer the now brand that can be  purchased at the pharmacy and [place] or the drugstore in [place] Follow-up aggressive therapeutic lifestyle changes walk on the golf course instead of riding and try to do more walking in town in the evening. Drink more water and follow diet closely and lose weight. It appears that your next colonoscopy is due this October or November and he may want to discuss with your wife who the gastroenterologist's that should be calling you. If you do not get a phone call by the end of October he should call us back and we will arrange to have a colonoscopy done because of your dad's history of colon cancer   Arrie Senate MD

## 2015-07-24 NOTE — Patient Instructions (Addendum)
Continue current medications. Continue good therapeutic lifestyle changes which include good diet and exercise. Fall precautions discussed with patient. If an FOBT was given today- please return it to our front desk. If you are over 64 years old - you may need Prevnar 38 or the adult Pneumonia vaccine.   After your visit with [place] today you will receive a survey in the mail or online from [employer] regarding your care with [place]. Please take a moment to fill this out. Your feedback is very important to [place] as you can help [place] better understand your patient needs as well as improve your experience and satisfaction. WE CARE ABOUT YOU!!!   The patient must take his medications regularly. Take vitamin D 3 1000 units, 1 daily over-the-counter. I prefer the now brand that can be purchased at the pharmacy and [place] or the drugstore in [place] Follow-up aggressive therapeutic lifestyle changes walk on the golf course instead of riding and try to do more walking in town in the evening. Drink more water and follow diet closely and lose weight. It appears that your next colonoscopy is due this October or November and he may want to discuss with your wife who the gastroenterologist's that should be calling you. If you do not get a phone call by the end of October he should call us back and we will arrange to have a colonoscopy done because of your dad's history of colon cancer

## 2015-07-25 ENCOUNTER — Encounter: Payer: Self-pay | Admitting: Family Medicine

## 2015-07-25 DIAGNOSIS — I700 Atherosclerosis of aorta: Secondary | ICD-10-CM | POA: Insufficient documentation

## 2015-07-25 LAB — PSA, TOTAL AND FREE
PROSTATE SPECIFIC AG, SERUM: 1.4 ng/mL (ref 0.0–4.0)
PSA FREE PCT: 19.3 %
PSA, Free: 0.27 ng/mL

## 2015-07-25 NOTE — Addendum Note (Signed)
Addended by: Thana Ates on: 07/25/2015 03:16 PM   Modules accepted: Orders

## 2015-07-26 ENCOUNTER — Other Ambulatory Visit: Payer: BLUE CROSS/BLUE SHIELD

## 2015-07-26 DIAGNOSIS — Z1211 Encounter for screening for malignant neoplasm of colon: Secondary | ICD-10-CM

## 2015-07-30 LAB — FECAL OCCULT BLOOD, IMMUNOCHEMICAL: FECAL OCCULT BLD: NEGATIVE

## 2015-09-16 NOTE — Progress Notes (Signed)
Cardiology Office Note   Date:  09/18/2015   ID:  Austin Clark, DOB Dec 07, 1950, MRN [id number]  PCP:  Redge Gainer, MD  Cardiologist:   Minus Breeding, MD   Chief Complaint  Patient presents with  . Atherosclerosis      History of Present Illness: Austin Clark is a 65 y.o. male who presents for evaluation of atherosclerosis noted on x-ray of his spine recently. He's had no past cardiac history. However, he does have risk factors. He doesn't exercise routinely though he does some yard work. He golfs. He was having some hip pain recently and underwent imaging. He does not describe calf discomfort.  He denies any chest pressure, neck or arm discomfort. He's had no palpitations, presyncope or syncope. He's had no PND or orthopnea. He's had no weight gain or edema.  Past Medical History:  Diagnosis Date  . BPH (benign prostatic hypertrophy)   . Colon polyps   . Cough   . Glaucoma 1993  . Hyperlipidemia   . Hypothyroid   . Knee pain   . Pulmonary nodule   . Tobacco use     Past Surgical History:  Procedure Laterality Date  . LASER SURGERY     GLAUCOMA   . TONSILLECTOMY       Current Outpatient Prescriptions  Medication Sig Dispense Refill  . LUMIGAN 0.01 % SOLN 1 DROP IN BOTH EYES AT BEDTIME  3  . niacin (NIASPAN) 1000 MG CR tablet Take 1,000 mg by mouth at bedtime.  3  . omega-3 acid ethyl esters (LOVAZA) 1 g capsule TAKE 2 CAPSULE BY MOUTH TWICE A DAY AS DIRECTED 120 capsule 3  . rosuvastatin (CRESTOR) 40 MG tablet Take 40 mg by mouth daily.    . Vitamin D, Ergocalciferol, (DRISDOL) 50000 units CAPS capsule Take 1 capsule (50,000 Units total) by mouth every 7 (seven) days. 12 capsule 3  . aspirin (LONGS ADULT LOW STRENGTH ASA) 81 MG EC tablet Take 81 mg by mouth daily.       No current facility-administered medications for this visit.     Allergies:   Penicillins    Social History:  The patient  reports that he quit smoking about 18 years ago. His smoking use  included Cigarettes. He started smoking about 48 years ago. He smoked 1.00 pack per day. He does not have any smokeless tobacco history on file. He reports that he drinks alcohol. He reports that he does not use drugs.   Family History:  The patient's family history includes Cancer in his father.    ROS:  Please see the history of present illness.   Otherwise, review of systems are positive for none.   All other systems are reviewed and negative.    PHYSICAL EXAM: VS:  BP 128/70   Pulse 64   Ht 5\' 11"  (1.803 m)   Wt 210 lb (95.3 kg)   BMI 29.29 kg/m  , BMI Body mass index is 29.29 kg/m. GENERAL:  Well appearing HEENT:  Pupils equal round and reactive, fundi not visualized, oral mucosa unremarkable NECK:  No jugular venous distention, waveform within normal limits, carotid upstroke brisk and symmetric, no bruits, no thyromegaly LYMPHATICS:  No cervical, inguinal adenopathy LUNGS:  Clear to auscultation bilaterally BACK:  No CVA tenderness CHEST:  Unremarkable HEART:  PMI not displaced or sustained,S1 and S2 within normal limits, no S3, no S4, no clicks, no rubs, no murmurs ABD:  Flat, positive bowel sounds normal in frequency in pitch,  no bruits, no rebound, no guarding, no midline pulsatile mass, no hepatomegaly, no splenomegaly EXT:  2 plus pulses throughout, no edema, no cyanosis no clubbing SKIN:  No rashes no nodules NEURO:  Cranial nerves II through XII grossly intact, motor grossly intact throughout PSYCH:  Cognitively intact, oriented to person place and time    EKG:  EKG is not ordered today. The ekg ordered 6/7/17demonstrates sinus bradycardia, rate 55, axis within normal limits, intervals within normal limits, RSR prime V1, no acute ST-T wave changes.   Recent Labs: 07/17/2015: ALT 18; BUN 13; Creatinine, Ser 0.98; Platelets 195; Potassium 4.3; Sodium 144    Lipid Panel    Component Value Date/Time   CHOL 195 07/17/2015 1008   CHOL 136 11/02/2012 0804   TRIG 118  07/17/2015 1008   TRIG 167 (H) 11/02/2012 0804   HDL 28 (L) 07/17/2015 1008   HDL 28 (L) 11/02/2012 0804   LDLCALC 69 11/09/2013 0857   LDLCALC 75 11/02/2012 0804      Wt Readings from Last 3 Encounters:  09/18/15 210 lb (95.3 kg)  07/24/15 216 lb (98 kg)  08/22/14 217 lb (98.4 kg)      Other studies Reviewed: Additional studies/ records that were reviewed today include: Xrays, Labs. . Review of the above records demonstrates:  Please see elsewhere in the note.     ASSESSMENT AND PLAN:  ATHEROSCLEROSIS:  He does have this noted incidentally in his aorta. He has risk factors. I would like him to start exercising. Before he does that I will bring the patient back for a POET (Plain Old Exercise Test). This will allow me to screen for obstructive coronary disease, risk stratify and very importantly provide a prescription for exercise.  DYSLIPIDEMIA:  He has a low HDL. I have suggested exercise which can raise this about 10%.   Current medicines are reviewed at length with the patient today.  The patient does not have concerns regarding medicines.  The following changes have been made:  no change  Labs/ tests ordered today include:   Orders Placed This Encounter  Procedures  . Exercise Tolerance Test     Disposition:   FU with me as needed.      Signed, Minus Breeding, MD  09/18/2015 3:29 PM    [facility]

## 2015-09-18 ENCOUNTER — Ambulatory Visit (INDEPENDENT_AMBULATORY_CARE_PROVIDER_SITE_OTHER): Payer: BLUE CROSS/BLUE SHIELD | Admitting: Cardiology

## 2015-09-18 ENCOUNTER — Encounter: Payer: Self-pay | Admitting: Cardiology

## 2015-09-18 VITALS — BP 128/70 | HR 64 | Ht 71.0 in | Wt 210.0 lb

## 2015-09-18 DIAGNOSIS — I7090 Unspecified atherosclerosis: Secondary | ICD-10-CM | POA: Diagnosis not present

## 2015-09-18 DIAGNOSIS — E785 Hyperlipidemia, unspecified: Secondary | ICD-10-CM | POA: Diagnosis not present

## 2015-09-18 DIAGNOSIS — R5383 Other fatigue: Secondary | ICD-10-CM | POA: Diagnosis not present

## 2015-09-18 NOTE — Patient Instructions (Signed)
Medication Instructions:  The current medical regimen is effective;  continue present plan and medications.  Testing/Procedures: Your physician has requested that you have an exercise tolerance test. For further information please visit [email or web address]. Please also follow instruction sheet, as given.  Follow-Up: Follow up as needed with Dr Percival Spanish.  If you need a refill on your cardiac medications before your next appointment, please call your pharmacy.  Thank you for choosing [facility]!!

## 2015-10-02 ENCOUNTER — Ambulatory Visit (INDEPENDENT_AMBULATORY_CARE_PROVIDER_SITE_OTHER): Payer: BLUE CROSS/BLUE SHIELD

## 2015-10-02 DIAGNOSIS — R5383 Other fatigue: Secondary | ICD-10-CM

## 2015-10-02 DIAGNOSIS — E785 Hyperlipidemia, unspecified: Secondary | ICD-10-CM

## 2015-10-02 LAB — EXERCISE TOLERANCE TEST
CHL CUP MPHR: 156 {beats}/min
CHL CUP RESTING HR STRESS: 62 {beats}/min
CSEPPHR: 139 {beats}/min
Estimated workload: 10.1 METS
Exercise duration (min): 8 min
Exercise duration (sec): 45 s
Percent HR: 89 %
RPE: 17

## 2015-10-07 ENCOUNTER — Other Ambulatory Visit: Payer: Self-pay | Admitting: *Deleted

## 2015-10-07 DIAGNOSIS — I2510 Atherosclerotic heart disease of native coronary artery without angina pectoris: Secondary | ICD-10-CM

## 2015-10-07 DIAGNOSIS — I2584 Coronary atherosclerosis due to calcified coronary lesion: Secondary | ICD-10-CM

## 2015-10-07 DIAGNOSIS — R9439 Abnormal result of other cardiovascular function study: Secondary | ICD-10-CM

## 2015-10-07 NOTE — Progress Notes (Signed)
Pt to be scheduled for [facility].  Instructions have been reviewed, a copy will be mailed to his home address and he is aware someone will be calling to schedule the testing.

## 2015-10-09 ENCOUNTER — Telehealth: Payer: Self-pay | Admitting: Family Medicine

## 2015-10-09 ENCOUNTER — Other Ambulatory Visit: Payer: Self-pay | Admitting: *Deleted

## 2015-10-09 DIAGNOSIS — Z8639 Personal history of other endocrine, nutritional and metabolic disease: Secondary | ICD-10-CM

## 2015-10-09 DIAGNOSIS — E559 Vitamin D deficiency, unspecified: Secondary | ICD-10-CM

## 2015-10-09 DIAGNOSIS — E785 Hyperlipidemia, unspecified: Secondary | ICD-10-CM

## 2015-10-09 DIAGNOSIS — I7090 Unspecified atherosclerosis: Secondary | ICD-10-CM

## 2015-10-09 NOTE — Telephone Encounter (Signed)
Future labs ordered and appt made with [facility] for knee pain - wants a referral.

## 2015-10-16 ENCOUNTER — Ambulatory Visit (INDEPENDENT_AMBULATORY_CARE_PROVIDER_SITE_OTHER): Payer: BLUE CROSS/BLUE SHIELD

## 2015-10-16 ENCOUNTER — Ambulatory Visit (INDEPENDENT_AMBULATORY_CARE_PROVIDER_SITE_OTHER): Payer: BLUE CROSS/BLUE SHIELD | Admitting: Family Medicine

## 2015-10-16 ENCOUNTER — Encounter: Payer: Self-pay | Admitting: Family Medicine

## 2015-10-16 VITALS — BP 106/63 | HR 51 | Temp 97.3°F | Ht 71.0 in | Wt 214.0 lb

## 2015-10-16 DIAGNOSIS — I7090 Unspecified atherosclerosis: Secondary | ICD-10-CM

## 2015-10-16 DIAGNOSIS — M25561 Pain in right knee: Secondary | ICD-10-CM

## 2015-10-16 NOTE — Progress Notes (Signed)
Subjective:    Patient ID: Austin Clark, male    DOB: [birth year], 65 y.o.   MRN: [id number]  HPI Patient here today for right knee pain. On reviewing the patient's record, the patient has a pending Myoview stress test planned for next week. With the exercise stress test that he is recently had he had elevated blood pressure and the test was stopped. Because of this I would prefer to avoid NSAIDs if possible. The patient's history with the knee is that he is been having some right knee pain with squatting in about 2-3 months ago this progressed and he had more aching in the right knee especially noticed at nighttime and with arising in the morning. A couple weeks ago he twisted the knee playing golf and the knee discomfort got worse but now it is back to where it was 2-3 months ago with a.m. and nighttime aching.   Patient Active Problem List   Diagnosis Date Noted  . Abdominal aortic atherosclerosis ([place]) 07/25/2015  . Family history of colon cancer 11/09/2013  . Hyperlipidemia 05/31/2012  . BPH (benign prostatic hyperplasia) 05/31/2012  . History of hypothyroidism 05/31/2012  . Testosterone deficiency 05/31/2012  . Vitamin D deficiency 05/31/2012  . Fatigue 05/31/2012   Outpatient Encounter Prescriptions as of 10/16/2015  Medication Sig  . aspirin (LONGS ADULT LOW STRENGTH ASA) 81 MG EC tablet Take 81 mg by mouth daily.    Marland Kitchen LUMIGAN 0.01 % SOLN 1 DROP IN BOTH EYES AT BEDTIME  . niacin (NIASPAN) 1000 MG CR tablet Take 1,000 mg by mouth at bedtime.  Marland Kitchen omega-3 acid ethyl esters (LOVAZA) 1 g capsule TAKE 2 CAPSULE BY MOUTH TWICE A DAY AS DIRECTED  . rosuvastatin (CRESTOR) 40 MG tablet Take 40 mg by mouth daily.  . Vitamin D, Ergocalciferol, (DRISDOL) 50000 units CAPS capsule Take 1 capsule (50,000 Units total) by mouth every 7 (seven) days.   No facility-administered encounter medications on file as of 10/16/2015.        Review of Systems  Constitutional: Negative.   HENT: Negative.     Eyes: Negative.   Respiratory: Negative.   Cardiovascular: Negative.   Gastrointestinal: Negative.   Endocrine: Negative.   Genitourinary: Negative.   Musculoskeletal: Positive for arthralgias (right knee pain).  Skin: Negative.   Allergic/Immunologic: Negative.   Neurological: Negative.   Hematological: Negative.   Psychiatric/Behavioral: Negative.        Objective:   Physical Exam  Constitutional: He is oriented to person, place, and time. He appears well-developed and well-nourished.  HENT:  Head: Normocephalic.  Eyes: Conjunctivae and EOM are normal. Pupils are equal, round, and reactive to light. Right eye exhibits no discharge. Left eye exhibits no discharge. No scleral icterus.  Neck: Normal range of motion.  Musculoskeletal: Normal range of motion. He exhibits tenderness. He exhibits no edema.  There was no apparent swelling of one knee compared to the other. With flexion and an extension there was minimal pain of the right knee. With flexion and extension and internal rotation the patient had pain at the medial joint line of the right knee. With external rotation there was minimal pain.  Neurological: He is alert and oriented to person, place, and time.  Skin: Skin is warm and dry. No rash noted.  Psychiatric: He has a normal mood and affect. His behavior is normal. Thought content normal.  Nursing note and vitals reviewed.  BP 106/63 (BP Location: Left Arm)   Pulse (!) 51   Temp  97.3 F (36.3 C) (Oral)   Ht 5\' 11"  (1.803 m)   Wt 214 lb (97.1 kg)   BMI 29.85 kg/m   [facility] reading (PRIMARY) by  Dr. Governor Rooks knee films with results pending                                        Assessment & Plan:  1. Right knee pain - DG Knee 1-2 Views Right; Future -Appointment with orthopedic for injection and follow-up  2. Atherosclerosis -Continue with plans for doing chemical stress test with cardiology  Patient Instructions  Use warm wet compresses and we will  arrange for an appointment with the orthopedic specialist that we'll be coming to our office tomorrow for possible injection with follow-up  Arrie Senate MD

## 2015-10-16 NOTE — Addendum Note (Signed)
Addended by: Zannie Cove on: 10/16/2015 08:22 AM   Modules accepted: Orders

## 2015-10-16 NOTE — Patient Instructions (Signed)
Use warm wet compresses and we will arrange for an appointment with the orthopedic specialist that we'll be coming to our office tomorrow for possible injection with follow-up

## 2015-10-22 ENCOUNTER — Telehealth (HOSPITAL_COMMUNITY): Payer: Self-pay | Admitting: *Deleted

## 2015-10-22 NOTE — Telephone Encounter (Signed)
Patient given detailed instructions per Myocardial Perfusion Study Information Sheet for the test on 10/23/15 Patient notified to arrive 15 minutes early and that it is imperative to arrive on time for appointment to keep from having the test rescheduled.  If you need to cancel or reschedule your appointment, please call the office within 24 hours of your appointment. Failure to do so may result in a cancellation of your appointment, and a $50 no show fee. Patient verbalized understanding.  Kirstie Peri

## 2015-10-23 ENCOUNTER — Ambulatory Visit (HOSPITAL_COMMUNITY): Payer: BLUE CROSS/BLUE SHIELD | Attending: Internal Medicine

## 2015-10-23 DIAGNOSIS — R9439 Abnormal result of other cardiovascular function study: Secondary | ICD-10-CM | POA: Insufficient documentation

## 2015-10-23 DIAGNOSIS — I2510 Atherosclerotic heart disease of native coronary artery without angina pectoris: Secondary | ICD-10-CM | POA: Diagnosis not present

## 2015-10-23 DIAGNOSIS — I2584 Coronary atherosclerosis due to calcified coronary lesion: Secondary | ICD-10-CM | POA: Insufficient documentation

## 2015-10-23 LAB — MYOCARDIAL PERFUSION IMAGING
CHL CUP NUCLEAR SRS: 2
CHL CUP RESTING HR STRESS: 50 {beats}/min
LHR: 0.36
LV sys vol: 66 mL
LVDIAVOL: 128 mL (ref 62–150)
NUC STRESS TID: 1.13
Peak HR: 74 {beats}/min
SDS: 3
SSS: 5

## 2015-10-23 MED ORDER — TECHNETIUM TC 99M TETROFOSMIN IV KIT
10.6000 | PACK | Freq: Once | INTRAVENOUS | Status: AC | PRN
  Administered 2015-10-23: 11 via INTRAVENOUS
  Filled 2015-10-23: qty 11

## 2015-10-23 MED ORDER — REGADENOSON 0.4 MG/5ML IV SOLN
0.4000 mg | Freq: Once | INTRAVENOUS | Status: AC
Start: 2015-10-23 — End: 2015-10-23
  Administered 2015-10-23: 0.4 mg via INTRAVENOUS

## 2015-10-23 MED ORDER — TECHNETIUM TC 99M TETROFOSMIN IV KIT
31.3000 | PACK | Freq: Once | INTRAVENOUS | Status: AC | PRN
  Administered 2015-10-23: 31.3 via INTRAVENOUS
  Filled 2015-10-23: qty 31

## 2015-10-31 ENCOUNTER — Telehealth: Payer: Self-pay | Admitting: *Deleted

## 2015-10-31 DIAGNOSIS — R9439 Abnormal result of other cardiovascular function study: Secondary | ICD-10-CM

## 2015-10-31 NOTE — Telephone Encounter (Signed)
Spoke with pt who is aware the EF or ejection fraction on his recent myoview was decreased and further testing has been ordered by Dr Percival Spanish.  Schedule appt for 2 D Echo at pt's convenience 9/29 at 8:30 am.   Staff message sent to precert.

## 2015-11-15 ENCOUNTER — Other Ambulatory Visit: Payer: Self-pay

## 2015-11-15 ENCOUNTER — Ambulatory Visit (HOSPITAL_COMMUNITY): Payer: BLUE CROSS/BLUE SHIELD | Attending: Cardiology

## 2015-11-15 DIAGNOSIS — I429 Cardiomyopathy, unspecified: Secondary | ICD-10-CM | POA: Insufficient documentation

## 2015-11-15 DIAGNOSIS — E785 Hyperlipidemia, unspecified: Secondary | ICD-10-CM | POA: Insufficient documentation

## 2015-11-15 DIAGNOSIS — I071 Rheumatic tricuspid insufficiency: Secondary | ICD-10-CM | POA: Insufficient documentation

## 2015-11-15 DIAGNOSIS — I340 Nonrheumatic mitral (valve) insufficiency: Secondary | ICD-10-CM | POA: Insufficient documentation

## 2015-11-15 DIAGNOSIS — R9439 Abnormal result of other cardiovascular function study: Secondary | ICD-10-CM | POA: Diagnosis not present

## 2015-11-15 DIAGNOSIS — Z720 Tobacco use: Secondary | ICD-10-CM | POA: Insufficient documentation

## 2015-11-15 DIAGNOSIS — I253 Aneurysm of heart: Secondary | ICD-10-CM | POA: Diagnosis not present

## 2015-11-20 ENCOUNTER — Ambulatory Visit (INDEPENDENT_AMBULATORY_CARE_PROVIDER_SITE_OTHER): Payer: BLUE CROSS/BLUE SHIELD

## 2015-11-20 DIAGNOSIS — Z23 Encounter for immunization: Secondary | ICD-10-CM | POA: Diagnosis not present

## 2015-11-22 ENCOUNTER — Other Ambulatory Visit (INDEPENDENT_AMBULATORY_CARE_PROVIDER_SITE_OTHER): Payer: BLUE CROSS/BLUE SHIELD

## 2015-11-22 DIAGNOSIS — E785 Hyperlipidemia, unspecified: Secondary | ICD-10-CM

## 2015-11-22 DIAGNOSIS — I7090 Unspecified atherosclerosis: Secondary | ICD-10-CM

## 2015-11-22 DIAGNOSIS — E559 Vitamin D deficiency, unspecified: Secondary | ICD-10-CM

## 2015-11-22 DIAGNOSIS — Z8639 Personal history of other endocrine, nutritional and metabolic disease: Secondary | ICD-10-CM

## 2015-11-23 LAB — BMP8+EGFR
BUN / CREAT RATIO: 12 (ref 10–24)
BUN: 13 mg/dL (ref 8–27)
CALCIUM: 9.3 mg/dL (ref 8.6–10.2)
CO2: 25 mmol/L (ref 18–29)
CREATININE: 1.08 mg/dL (ref 0.76–1.27)
Chloride: 103 mmol/L (ref 96–106)
GFR, EST AFRICAN AMERICAN: 83 mL/min/{1.73_m2} (ref >59)
GFR, EST NON AFRICAN AMERICAN: 72 mL/min/{1.73_m2} (ref >59)
Glucose: 101 mg/dL — ABNORMAL HIGH (ref 65–99)
Potassium: 4.8 mmol/L (ref 3.5–5.2)
Sodium: 142 mmol/L (ref 134–144)

## 2015-11-23 LAB — CBC WITH DIFFERENTIAL/PLATELET
BASOS: 0 %
Basophils Absolute: 0 10*3/uL (ref 0.0–0.2)
EOS (ABSOLUTE): 0.2 10*3/uL (ref 0.0–0.4)
EOS: 2 %
HEMATOCRIT: 43.3 % (ref 37.5–51.0)
HEMOGLOBIN: 14.8 g/dL (ref 12.6–17.7)
IMMATURE GRANS (ABS): 0 10*3/uL (ref 0.0–0.1)
Immature Granulocytes: 0 %
LYMPHS ABS: 2.4 10*3/uL (ref 0.7–3.1)
LYMPHS: 25 %
MCH: 33.3 pg — AB (ref 26.6–33.0)
MCHC: 34.2 g/dL (ref 31.5–35.7)
MCV: 97 fL (ref 79–97)
MONOCYTES: 6 %
Monocytes Absolute: 0.6 10*3/uL (ref 0.1–0.9)
NEUTROS ABS: 6.6 10*3/uL (ref 1.4–7.0)
Neutrophils: 67 %
Platelets: 176 10*3/uL (ref 150–379)
RBC: 4.45 x10E6/uL (ref 4.14–5.80)
RDW: 14.1 % (ref 12.3–15.4)
WBC: 9.7 10*3/uL (ref 3.4–10.8)

## 2015-11-23 LAB — NMR, LIPOPROFILE
Cholesterol: 109 mg/dL (ref 100–199)
HDL Cholesterol by NMR: 31 mg/dL — ABNORMAL LOW (ref >39)
HDL PARTICLE NUMBER: 29.2 umol/L — AB (ref >=30.5)
LDL PARTICLE NUMBER: 848 nmol/L (ref <1000)
LDL SIZE: 20.3 nm (ref >20.5)
LDL-C: 59 mg/dL (ref 0–99)
LP-IR SCORE: 47 — AB (ref <=45)
Small LDL Particle Number: 589 nmol/L — ABNORMAL HIGH (ref <=527)
Triglycerides by NMR: 94 mg/dL (ref 0–149)

## 2015-11-23 LAB — HEPATIC FUNCTION PANEL
ALK PHOS: 64 IU/L (ref 39–117)
ALT: 32 IU/L (ref 0–44)
AST: 30 IU/L (ref 0–40)
Albumin: 4.6 g/dL (ref 3.6–4.8)
BILIRUBIN, DIRECT: 0.16 mg/dL (ref 0.00–0.40)
Bilirubin Total: 0.4 mg/dL (ref 0.0–1.2)
TOTAL PROTEIN: 6.9 g/dL (ref 6.0–8.5)

## 2015-11-23 LAB — THYROID PANEL WITH TSH
Free Thyroxine Index: 2.4 (ref 1.2–4.9)
T3 Uptake Ratio: 31 % (ref 24–39)
T4 TOTAL: 7.6 ug/dL (ref 4.5–12.0)
TSH: 2.95 u[IU]/mL (ref 0.450–4.500)

## 2015-11-23 LAB — VITAMIN D 25 HYDROXY (VIT D DEFICIENCY, FRACTURES): VIT D 25 HYDROXY: 52.5 ng/mL (ref 30.0–100.0)

## 2015-11-25 ENCOUNTER — Ambulatory Visit (INDEPENDENT_AMBULATORY_CARE_PROVIDER_SITE_OTHER): Payer: BLUE CROSS/BLUE SHIELD | Admitting: Family Medicine

## 2015-11-25 ENCOUNTER — Ambulatory Visit (INDEPENDENT_AMBULATORY_CARE_PROVIDER_SITE_OTHER): Payer: BLUE CROSS/BLUE SHIELD

## 2015-11-25 ENCOUNTER — Encounter: Payer: Self-pay | Admitting: Family Medicine

## 2015-11-25 VITALS — BP 112/63 | HR 65 | Temp 97.4°F | Ht 71.0 in | Wt 210.0 lb

## 2015-11-25 DIAGNOSIS — E784 Other hyperlipidemia: Secondary | ICD-10-CM

## 2015-11-25 DIAGNOSIS — Z800 Family history of malignant neoplasm of digestive organs: Secondary | ICD-10-CM

## 2015-11-25 DIAGNOSIS — E559 Vitamin D deficiency, unspecified: Secondary | ICD-10-CM | POA: Diagnosis not present

## 2015-11-25 DIAGNOSIS — I7090 Unspecified atherosclerosis: Secondary | ICD-10-CM | POA: Diagnosis not present

## 2015-11-25 DIAGNOSIS — N400 Enlarged prostate without lower urinary tract symptoms: Secondary | ICD-10-CM

## 2015-11-25 DIAGNOSIS — E7849 Other hyperlipidemia: Secondary | ICD-10-CM

## 2015-11-25 NOTE — Progress Notes (Signed)
Subjective:    Patient ID: Austin Clark, male    DOB: June 11, 1950, 65 y.o.   MRN: [id number]  HPI Pt here for follow up and management of chronic medical problems which includes hyperlipidemia. He is taking medications regularly.This patient has had recent lab work. The results will be reviewed with him during the visit today. The most significant change is that there is been a dramatic drop from the LDL particle #2630 down to 848. The LDL C has come down from 143-59. Triglycerides are improved at 94. HDL particle number remains low but has improved since the last visit. All liver function tests were normal. All thyroid tests were normal. The vitamin D level was good at 52.5. CBC was normal. Creatinine, the most important kidney function test along with electrolytes were normal. The patient recently had an abnormal cardiovascular stress test. He had an echocardiogram that had an ejection fraction at 50-55%. He did have a chemical stress test and there was no ST segment deviation noted. There were no arrhythmias there is no significant finding it was interpreted and nonconclusive. With the echocardiogram his left ventricular systolic function was normal with only a trace MR and TR. The patient's colonoscopy is due and he will schedule this. His father had a history of colon cancer. He is also going to schedule his visit with the knee surgeon for further evaluation. He is due to get a chest x-ray today before he leaves the practice.    Patient Active Problem List   Diagnosis Date Noted  . Abdominal aortic atherosclerosis ([place]) 07/25/2015  . Family history of colon cancer 11/09/2013  . Hyperlipidemia 05/31/2012  . BPH (benign prostatic hyperplasia) 05/31/2012  . History of hypothyroidism 05/31/2012  . Testosterone deficiency 05/31/2012  . Vitamin D deficiency 05/31/2012  . Fatigue 05/31/2012   Outpatient Encounter Prescriptions as of 11/25/2015  Medication Sig  . aspirin (LONGS ADULT LOW STRENGTH  ASA) 81 MG EC tablet Take 81 mg by mouth daily.    Marland Kitchen LUMIGAN 0.01 % SOLN 1 DROP IN BOTH EYES AT BEDTIME  . niacin (NIASPAN) 1000 MG CR tablet Take 1,000 mg by mouth at bedtime.  Marland Kitchen omega-3 acid ethyl esters (LOVAZA) 1 g capsule TAKE 2 CAPSULE BY MOUTH TWICE A DAY AS DIRECTED  . rosuvastatin (CRESTOR) 40 MG tablet Take 40 mg by mouth daily.  . Vitamin D, Ergocalciferol, (DRISDOL) 50000 units CAPS capsule Take 1 capsule (50,000 Units total) by mouth every 7 (seven) days.   No facility-administered encounter medications on file as of 11/25/2015.       Review of Systems  Constitutional: Negative.   HENT: Negative.   Eyes: Negative.   Respiratory: Negative.   Cardiovascular: Negative.   Gastrointestinal: Negative.   Endocrine: Negative.   Genitourinary: Negative.   Musculoskeletal: Positive for arthralgias (right knee pain - seeing ortho (tear)).  Skin: Negative.   Allergic/Immunologic: Negative.   Neurological: Negative.   Hematological: Negative.   Psychiatric/Behavioral: Negative.        Objective:   Physical Exam  Constitutional: He is oriented to person, place, and time. He appears well-developed and well-nourished.  The patient is pleasant and alert  HENT:  Head: Normocephalic and atraumatic.  Right Ear: External ear normal.  Left Ear: External ear normal.  Mouth/Throat: Oropharynx is clear and moist. No oropharyngeal exudate.  Nasal turbinate congestion bilaterally  Eyes: Conjunctivae and EOM are normal. Pupils are equal, round, and reactive to light. Right eye exhibits no discharge. Left eye exhibits no  discharge. No scleral icterus.  Neck: Normal range of motion. Neck supple. No thyromegaly present.  No thyroid enlargement or anterior cervical nodes or bruits  Cardiovascular: Normal rate, regular rhythm, normal heart sounds and intact distal pulses.   No murmur heard. Heart is regular at 72/m  Pulmonary/Chest: Effort normal and breath sounds normal. No respiratory  distress. He has no wheezes. He has no rales. He exhibits no tenderness.  No axillary adenopathy  Abdominal: Soft. Bowel sounds are normal. He exhibits no mass. There is no tenderness. There is no rebound and no guarding.  No spleen or liver enlargement no masses and no inguinal adenopathy  Musculoskeletal: Normal range of motion. He exhibits no edema.  Lymphadenopathy:    He has no cervical adenopathy.  Neurological: He is alert and oriented to person, place, and time. He has normal reflexes. No cranial nerve deficit.  Skin: Skin is warm and dry. No rash noted.  Psychiatric: He has a normal mood and affect. His behavior is normal. Judgment and thought content normal.  The patient seems more upbeat and positive today in his demeanor. We will really pleased with his cholesterol numbers and he has a plan of action in place to follow-up with his orthopedic surgeon and to get his colonoscopy.  Nursing note and vitals reviewed.   BP 112/63 (BP Location: Left Arm)   Pulse 65   Temp 97.4 F (36.3 C) (Oral)   Ht 5\' 11"  (1.803 m)   Wt 210 lb (95.3 kg)   BMI 29.29 kg/m   Chest x-ray with results pending     Assessment & Plan:  1. Other hyperlipidemia -Continue with Crestor and Niaspan and aggressive therapeutic lifestyle changes - DG Chest 2 View; Future  2. Atherosclerosis -Continue treating hyperlipidemia and with diet and exercise - DG Chest 2 View; Future  3. Benign prostatic hyperplasia, unspecified whether lower urinary tract symptoms present -Follow-up with urology as needed  4. Vitamin D deficiency -Continue current vitamin D replacement  5. Family history of colon cancer -Patient to schedule appointment for colonoscopy because his father died with colon cancer. His last colonoscopy was almost 5 years ago.  Patient Instructions  Continue current medications. Continue good therapeutic lifestyle changes which include good diet and exercise. Fall precautions discussed with  patient. If an FOBT was given today- please return it to our front desk. If you are over 66 years old - you may need Prevnar 68 or the adult Pneumonia vaccine.  **Flu shots are available--- please call and schedule a FLU-CLINIC appointment**  After your visit with [place] today you will receive a survey in the mail or online from [employer] regarding your care with [place]. Please take a moment to fill this out. Your feedback is very important to [place] as you can help [place] better understand your patient needs as well as improve your experience and satisfaction. WE CARE ABOUT YOU!!!   Make sure they get your flu shot at work and notify us so that we can enter that into your record that you've had the flu shot Do not forget to schedule your appointment for your colonoscopy because of your positive family history Follow-up with orthopedist for your poor and meniscus and surgery as planned We will call you with the results of the chest x-ray as soon as those results become available    Arrie Senate MD

## 2015-11-25 NOTE — Patient Instructions (Addendum)
Continue current medications. Continue good therapeutic lifestyle changes which include good diet and exercise. Fall precautions discussed with patient. If an FOBT was given today- please return it to our front desk. If you are over 64 years old - you may need Prevnar 73 or the adult Pneumonia vaccine.  **Flu shots are available--- please call and schedule a FLU-CLINIC appointment**  After your visit with [place] today you will receive a survey in the mail or online from [employer] regarding your care with [place]. Please take a moment to fill this out. Your feedback is very important to [place] as you can help [place] better understand your patient needs as well as improve your experience and satisfaction. WE CARE ABOUT YOU!!!   Make sure they get your flu shot at work and notify us so that we can enter that into your record that you've had the flu shot Do not forget to schedule your appointment for your colonoscopy because of your positive family history Follow-up with orthopedist for your poor and meniscus and surgery as planned We will call you with the results of the chest x-ray as soon as those results become available

## 2015-11-26 ENCOUNTER — Telehealth: Payer: Self-pay | Admitting: Family Medicine

## 2015-11-26 DIAGNOSIS — Z1159 Encounter for screening for other viral diseases: Secondary | ICD-10-CM

## 2015-11-26 NOTE — Telephone Encounter (Signed)
Please call patient regarding his colonoscopy

## 2015-12-06 ENCOUNTER — Telehealth: Payer: Self-pay | Admitting: Family Medicine

## 2015-12-18 ENCOUNTER — Other Ambulatory Visit (INDEPENDENT_AMBULATORY_CARE_PROVIDER_SITE_OTHER): Payer: BLUE CROSS/BLUE SHIELD

## 2015-12-18 DIAGNOSIS — Z1159 Encounter for screening for other viral diseases: Secondary | ICD-10-CM

## 2015-12-19 LAB — HEPATITIS C ANTIBODY: Hep C Virus Ab: <0.1 s/co ratio (ref 0.0–0.9)

## 2015-12-20 ENCOUNTER — Encounter: Payer: Self-pay | Admitting: Family Medicine

## 2015-12-20 HISTORY — PX: COLONOSCOPY: SHX174

## 2016-01-19 ENCOUNTER — Encounter: Payer: Self-pay | Admitting: Cardiology

## 2016-06-30 ENCOUNTER — Other Ambulatory Visit: Payer: Self-pay

## 2016-06-30 MED ORDER — OMEGA-3-ACID ETHYL ESTERS 1 G PO CAPS: ORAL_CAPSULE | ORAL | 0 refills | Status: DC

## 2016-07-29 ENCOUNTER — Ambulatory Visit (INDEPENDENT_AMBULATORY_CARE_PROVIDER_SITE_OTHER): Payer: BLUE CROSS/BLUE SHIELD | Admitting: Family Medicine

## 2016-07-29 ENCOUNTER — Encounter: Payer: Self-pay | Admitting: Family Medicine

## 2016-07-29 VITALS — BP 117/60 | HR 46 | Temp 97.7°F | Ht 71.0 in | Wt 205.0 lb

## 2016-07-29 DIAGNOSIS — E559 Vitamin D deficiency, unspecified: Secondary | ICD-10-CM

## 2016-07-29 DIAGNOSIS — Z0000 Encounter for general adult medical examination without abnormal findings: Secondary | ICD-10-CM | POA: Diagnosis not present

## 2016-07-29 DIAGNOSIS — N400 Enlarged prostate without lower urinary tract symptoms: Secondary | ICD-10-CM

## 2016-07-29 DIAGNOSIS — Z800 Family history of malignant neoplasm of digestive organs: Secondary | ICD-10-CM

## 2016-07-29 DIAGNOSIS — R972 Elevated prostate specific antigen [PSA]: Secondary | ICD-10-CM

## 2016-07-29 DIAGNOSIS — Z6829 Body mass index (BMI) 29.0-29.9, adult: Secondary | ICD-10-CM

## 2016-07-29 DIAGNOSIS — E7849 Other hyperlipidemia: Secondary | ICD-10-CM

## 2016-07-29 LAB — URINALYSIS, COMPLETE
Bilirubin, UA: NEGATIVE
GLUCOSE, UA: NEGATIVE
KETONES UA: NEGATIVE
Leukocytes, UA: NEGATIVE
NITRITE UA: NEGATIVE
Protein, UA: NEGATIVE
RBC, UA: NEGATIVE
SPEC GRAV UA: 1.02 (ref 1.005–1.030)
UUROB: 0.2 mg/dL (ref 0.2–1.0)
pH, UA: 5.5 (ref 5.0–7.5)

## 2016-07-29 LAB — MICROSCOPIC EXAMINATION
Bacteria, UA: NONE SEEN
Epithelial Cells (non renal): NONE SEEN /hpf (ref 0–10)
RBC MICROSCOPIC, UA: NONE SEEN /HPF (ref 0–?)
WBC UA: NONE SEEN /HPF (ref 0–?)

## 2016-07-29 NOTE — Progress Notes (Signed)
Subjective:    Patient ID: Austin Clark, male    DOB: [birth year], 66 y.o.   MRN: [id number] He has had cataract surgery in the past few months on both eyes. He last saw his eye doctor one month ago. HPI Patient is here today for annual wellness exam and follow up of chronic medical problems which includes hyperlipidemia. He is taking medication regularly.The patient is doing well overall with no specific complaints. He is due for his physical exam today including a prostate exam and he will be given an FOBT to return and will get lab work. His last colonoscopy was in November of this past year. The patient is pleasant and alert and in good spirits. He had his colonoscopy in November of this past year and they did find a polyp. There is a family history of colon cancer in that his father had colon cancer. He will need another colonoscopy in 5 years. The patient is been working for the [employer] for 34 years and he plans to retire the end of July. He denies any chest pain. He did have a thorough preventative cardiac workup and this was negative. He denies any shortness of breath. He does occasionally have trouble swallowing cold water when he is hot. This is not changed over the years. He has no trouble with swallowing solid foods. The patient is voiding without problems. He has a history of a low testosterone level. He is tried various medication without help for this. He is not interested in any medication. We did discuss Niaspan use and his insurance will not pay for this but he will be very expensive if they do pay for it. We told him to finish what he has and leave it off and we will check another cholesterol panel at his next visit and compared to this 1 and make sure there is no difference in any of the numbers on this.     Patient Active Problem List   Diagnosis Date Noted  . Abdominal aortic atherosclerosis ([place]) 07/25/2015  . Family history of colon cancer 11/09/2013  . Hyperlipidemia  05/31/2012  . BPH (benign prostatic hyperplasia) 05/31/2012  . History of hypothyroidism 05/31/2012  . Testosterone deficiency 05/31/2012  . Vitamin D deficiency 05/31/2012  . Fatigue 05/31/2012   Outpatient Encounter Prescriptions as of 07/29/2016  Medication Sig  . aspirin (LONGS ADULT LOW STRENGTH ASA) 81 MG EC tablet Take 81 mg by mouth daily.    Marland Kitchen LUMIGAN 0.01 % SOLN 1 DROP IN BOTH EYES AT BEDTIME  . niacin (NIASPAN) 1000 MG CR tablet Take 1,000 mg by mouth at bedtime.  Marland Kitchen omega-3 acid ethyl esters (LOVAZA) 1 g capsule TAKE 2 CAPSULE BY MOUTH TWICE A DAY AS DIRECTED  . rosuvastatin (CRESTOR) 40 MG tablet Take 40 mg by mouth daily.  . Vitamin D, Ergocalciferol, (DRISDOL) 50000 units CAPS capsule Take 1 capsule (50,000 Units total) by mouth every 7 (seven) days.   No facility-administered encounter medications on file as of 07/29/2016.      Review of Systems  Constitutional: Negative.   HENT: Negative.   Eyes: Negative.   Respiratory: Negative.   Cardiovascular: Negative.   Gastrointestinal: Negative.   Endocrine: Negative.   Genitourinary: Negative.   Musculoskeletal: Negative.   Skin: Negative.   Allergic/Immunologic: Negative.   Neurological: Negative.   Hematological: Negative.   Psychiatric/Behavioral: Negative.        Objective:   Physical Exam  Constitutional: He is oriented to person, place, and  time. He appears well-developed and well-nourished.  The patient is pleasant and alert and excited about retiring soon  HENT:  Head: Normocephalic and atraumatic.  Right Ear: External ear normal.  Left Ear: External ear normal.  Mouth/Throat: Oropharynx is clear and moist. No oropharyngeal exudate.  Nasal turbinate congestion bilaterally  Eyes: Conjunctivae and EOM are normal. Pupils are equal, round, and reactive to light. Right eye exhibits no discharge. Left eye exhibits no discharge. No scleral icterus.  Up-to-date on eye exam, recent bilateral cataract surgery    Neck: Normal range of motion. Neck supple. No thyromegaly present.  No bruits thyromegaly or anterior cervical adenopathy  Cardiovascular: Normal rate, regular rhythm, normal heart sounds and intact distal pulses.   No murmur heard. The heart was regular at 60/m  Pulmonary/Chest: Effort normal and breath sounds normal. No respiratory distress. He has no wheezes. He has no rales. He exhibits no tenderness.  No axillary adenopathy and chest was clear anteriorly and posteriorly  Abdominal: Soft. Bowel sounds are normal. He exhibits no mass. There is no tenderness. There is no rebound and no guarding.  No abdominal tenderness masses or organ enlargement or bruits  Genitourinary: Rectum normal, prostate normal and penis normal.  Genitourinary Comments: The prostate was enlarged but soft and smooth. There were no rectal masses. The external genitalia were within normal limits and no hernias were palpated.  Musculoskeletal: Normal range of motion. He exhibits no edema.  Lymphadenopathy:    He has no cervical adenopathy.  Neurological: He is alert and oriented to person, place, and time. He has normal reflexes. No cranial nerve deficit.  Skin: Skin is warm and dry. No rash noted.  Psychiatric: He has a normal mood and affect. His behavior is normal. Judgment and thought content normal.  Nursing note and vitals reviewed.  BP 117/60 (BP Location: Left Arm)   Pulse (!) 46   Temp 97.7 F (36.5 C) (Oral)   Ht 5' 11" (1.803 m)   Wt 205 lb (93 kg)   BMI 28.59 kg/m         Assessment & Plan:  1. Annual physical exam -The patient was reminded that his next colonoscopy would be in November 2022 he was asked to remember that date. - BMP8+EGFR - CBC with Differential/Platelet - Hepatic function panel - VITAMIN D 25 Hydroxy (Vit-D Deficiency, Fractures) - NMR, lipoprofile - PSA, total and free - Urinalysis, Complete  2. Benign prostatic hyperplasia, unspecified whether lower urinary tract  symptoms present -The patient has no complaints with voiding his prostate is enlarged but smooth. - CBC with Differential/Platelet - PSA, total and free - Urinalysis, Complete  3. Other hyperlipidemia -Because of insurance issues the patient will discontinue the Niaspan after his current supply is completed and we will make sure the next cholesterol check that this discontinuation does not impact his cholesterol numbers - BMP8+EGFR - CBC with Differential/Platelet - Hepatic function panel - NMR, lipoprofile  4. Vitamin D deficiency -Continue vitamin D replacement pending results of lab work - CBC with Differential/Platelet - VITAMIN D 25 Hydroxy (Vit-D Deficiency, Fractures)  5. Family history of colon cancer -Next colonoscopy November 2022 by Dr. Amedeo Plenty  6. BMI 29.0-29.9,adult -The patient will continue to work aggressively with his diet and making all efforts of keeping his weight down and drinking plenty of water  7. Elevated PSA -This has been elevated in the past and he did not see a urologist because apparently it came back down. The last PSA 1  year ago was 1.4.  Patient Instructions  Continue current medications. Continue good therapeutic lifestyle changes which include good diet and exercise. Fall precautions discussed with patient. If an FOBT was given today- please return it to our front desk. If you are over 70 years old - you may need Prevnar 4 or the adult Pneumonia vaccine.  **Flu shots are available--- please call and schedule a FLU-CLINIC appointment**  After your visit with [place] today you will receive a survey in the mail or online from [employer] regarding your care with [place]. Please take a moment to fill this out. Your feedback is very important to [place] as you can help [place] better understand your patient needs as well as improve your experience and satisfaction. WE CARE ABOUT YOU!!!   Continue with aggressive therapeutic lifestyle changes making all efforts to lose  weight and drink plenty of water Enjoy your future retirement and be thankful! Stay active physically! We will call with lab work results as soon as those results become available You can discontinue your Niaspan after the current prescription is completed    Arrie Senate MD

## 2016-07-29 NOTE — Patient Instructions (Addendum)
Continue current medications. Continue good therapeutic lifestyle changes which include good diet and exercise. Fall precautions discussed with patient. If an FOBT was given today- please return it to our front desk. If you are over 65 years old - you may need Prevnar 62 or the adult Pneumonia vaccine.  **Flu shots are available--- please call and schedule a FLU-CLINIC appointment**  After your visit with [place] today you will receive a survey in the mail or online from [employer] regarding your care with [place]. Please take a moment to fill this out. Your feedback is very important to [place] as you can help [place] better understand your patient needs as well as improve your experience and satisfaction. WE CARE ABOUT YOU!!!   Continue with aggressive therapeutic lifestyle changes making all efforts to lose weight and drink plenty of water Enjoy your future retirement and be thankful! Stay active physically! We will call with lab work results as soon as those results become available You can discontinue your Niaspan after the current prescription is completed

## 2016-07-30 LAB — BMP8+EGFR
BUN/Creatinine Ratio: 10 (ref 10–24)
BUN: 11 mg/dL (ref 8–27)
CO2: 26 mmol/L (ref 20–29)
CREATININE: 1.06 mg/dL (ref 0.76–1.27)
Calcium: 9.4 mg/dL (ref 8.6–10.2)
Chloride: 104 mmol/L (ref 96–106)
GFR calc Af Amer: 85 mL/min/{1.73_m2} (ref >59)
GFR, EST NON AFRICAN AMERICAN: 74 mL/min/{1.73_m2} (ref >59)
Glucose: 103 mg/dL — ABNORMAL HIGH (ref 65–99)
POTASSIUM: 5.2 mmol/L (ref 3.5–5.2)
SODIUM: 145 mmol/L — AB (ref 134–144)

## 2016-07-30 LAB — PSA, TOTAL AND FREE
PSA FREE PCT: 33.3 %
PSA, Free: 0.2 ng/mL
Prostate Specific Ag, Serum: 0.6 ng/mL (ref 0.0–4.0)

## 2016-07-30 LAB — CBC WITH DIFFERENTIAL/PLATELET
Basophils Absolute: 0 10*3/uL (ref 0.0–0.2)
Basos: 1 %
EOS (ABSOLUTE): 0.2 10*3/uL (ref 0.0–0.4)
EOS: 2 %
HEMOGLOBIN: 14.4 g/dL (ref 13.0–17.7)
Hematocrit: 43.5 % (ref 37.5–51.0)
IMMATURE GRANS (ABS): 0 10*3/uL (ref 0.0–0.1)
IMMATURE GRANULOCYTES: 0 %
LYMPHS: 35 %
Lymphocytes Absolute: 2.3 10*3/uL (ref 0.7–3.1)
MCH: 33.1 pg — ABNORMAL HIGH (ref 26.6–33.0)
MCHC: 33.1 g/dL (ref 31.5–35.7)
MCV: 100 fL — ABNORMAL HIGH (ref 79–97)
MONOS ABS: 0.4 10*3/uL (ref 0.1–0.9)
Monocytes: 6 %
NEUTROS ABS: 3.7 10*3/uL (ref 1.4–7.0)
NEUTROS PCT: 56 %
PLATELETS: 167 10*3/uL (ref 150–379)
RBC: 4.35 x10E6/uL (ref 4.14–5.80)
RDW: 13.6 % (ref 12.3–15.4)
WBC: 6.6 10*3/uL (ref 3.4–10.8)

## 2016-07-30 LAB — NMR, LIPOPROFILE
CHOLESTEROL: 109 mg/dL (ref 100–199)
HDL CHOLESTEROL BY NMR: 35 mg/dL — AB (ref >39)
HDL PARTICLE NUMBER: 30.7 umol/L (ref >=30.5)
LDL Particle Number: 897 nmol/L (ref <1000)
LDL SIZE: 20 nm (ref >20.5)
LDL-C: 55 mg/dL (ref 0–99)
LP-IR Score: 49 — ABNORMAL HIGH (ref <=45)
SMALL LDL PARTICLE NUMBER: 605 nmol/L — AB (ref <=527)
TRIGLYCERIDES BY NMR: 93 mg/dL (ref 0–149)

## 2016-07-30 LAB — HEPATIC FUNCTION PANEL
ALK PHOS: 64 IU/L (ref 39–117)
ALT: 25 IU/L (ref 0–44)
AST: 24 IU/L (ref 0–40)
Albumin: 4.6 g/dL (ref 3.6–4.8)
BILIRUBIN TOTAL: 0.5 mg/dL (ref 0.0–1.2)
BILIRUBIN, DIRECT: 0.13 mg/dL (ref 0.00–0.40)
TOTAL PROTEIN: 6.6 g/dL (ref 6.0–8.5)

## 2016-07-30 LAB — VITAMIN D 25 HYDROXY (VIT D DEFICIENCY, FRACTURES): Vit D, 25-Hydroxy: 51.9 ng/mL (ref 30.0–100.0)

## 2016-09-03 DIAGNOSIS — Z961 Presence of intraocular lens: Secondary | ICD-10-CM | POA: Diagnosis not present

## 2016-09-03 DIAGNOSIS — H401133 Primary open-angle glaucoma, bilateral, severe stage: Secondary | ICD-10-CM | POA: Diagnosis not present

## 2016-10-21 ENCOUNTER — Other Ambulatory Visit: Payer: Self-pay | Admitting: Family Medicine

## 2016-11-06 ENCOUNTER — Other Ambulatory Visit: Payer: Self-pay | Admitting: Family Medicine

## 2016-11-26 DIAGNOSIS — H401133 Primary open-angle glaucoma, bilateral, severe stage: Secondary | ICD-10-CM | POA: Diagnosis not present

## 2017-01-20 ENCOUNTER — Other Ambulatory Visit: Payer: Self-pay | Admitting: Family Medicine

## 2017-01-21 ENCOUNTER — Other Ambulatory Visit: Payer: Self-pay | Admitting: Family Medicine

## 2017-01-21 NOTE — Telephone Encounter (Signed)
OV 01/28/17 

## 2017-01-29 ENCOUNTER — Ambulatory Visit (INDEPENDENT_AMBULATORY_CARE_PROVIDER_SITE_OTHER): Payer: PPO | Admitting: Family Medicine

## 2017-01-29 ENCOUNTER — Encounter: Payer: Self-pay | Admitting: Family Medicine

## 2017-01-29 VITALS — BP 115/60 | HR 51 | Temp 97.8°F | Ht 71.0 in | Wt 202.0 lb

## 2017-01-29 DIAGNOSIS — E349 Endocrine disorder, unspecified: Secondary | ICD-10-CM

## 2017-01-29 DIAGNOSIS — Z0000 Encounter for general adult medical examination without abnormal findings: Secondary | ICD-10-CM | POA: Diagnosis not present

## 2017-01-29 DIAGNOSIS — N400 Enlarged prostate without lower urinary tract symptoms: Secondary | ICD-10-CM | POA: Diagnosis not present

## 2017-01-29 DIAGNOSIS — J301 Allergic rhinitis due to pollen: Secondary | ICD-10-CM

## 2017-01-29 DIAGNOSIS — E559 Vitamin D deficiency, unspecified: Secondary | ICD-10-CM | POA: Diagnosis not present

## 2017-01-29 DIAGNOSIS — E7849 Other hyperlipidemia: Secondary | ICD-10-CM

## 2017-01-29 DIAGNOSIS — I700 Atherosclerosis of aorta: Secondary | ICD-10-CM

## 2017-01-29 DIAGNOSIS — Z800 Family history of malignant neoplasm of digestive organs: Secondary | ICD-10-CM

## 2017-01-29 LAB — MICROSCOPIC EXAMINATION
Bacteria, UA: NONE SEEN
Epithelial Cells (non renal): NONE SEEN /hpf (ref 0–10)
RBC MICROSCOPIC, UA: NONE SEEN /HPF (ref 0–?)
Renal Epithel, UA: NONE SEEN /hpf
WBC UA: NONE SEEN /HPF (ref 0–?)

## 2017-01-29 LAB — URINALYSIS, COMPLETE
BILIRUBIN UA: NEGATIVE
GLUCOSE, UA: NEGATIVE
KETONES UA: NEGATIVE
Leukocytes, UA: NEGATIVE
NITRITE UA: NEGATIVE
PROTEIN UA: NEGATIVE
RBC, UA: NEGATIVE
UUROB: 0.2 mg/dL (ref 0.2–1.0)
pH, UA: 5 (ref 5.0–7.5)

## 2017-01-29 MED ORDER — FLUTICASONE PROPIONATE 50 MCG/ACT NA SUSP: 2.0000 | Freq: Every day | NASAL | 11 refills | Status: DC

## 2017-01-29 MED ORDER — AZELASTINE HCL 0.1 % NA SOLN: 1.0000 | Freq: Every day | NASAL | 12 refills | Status: DC

## 2017-01-29 NOTE — Patient Instructions (Addendum)
Medicare Annual Wellness Visit  [facility] and the medical providers at [facility] strive to bring you the best medical care.  In doing so we not only want to address your current medical conditions and concerns but also to detect new conditions early and prevent illness, disease and health-related problems.    Medicare offers a yearly Wellness Visit which allows our clinical staff to assess your need for preventative services including immunizations, lifestyle education, counseling to decrease risk of preventable diseases and screening for fall risk and other medical concerns.    This visit is provided free of charge (no copay) for all Medicare recipients. The clinical pharmacists at [facility] have begun to conduct these Wellness Visits which will also include a thorough review of all your medications.    As you primary medical provider recommend that you make an appointment for your Annual Wellness Visit if you have not done so already this year.  You may set up this appointment before you leave today or you may call back ([phone]) and schedule an appointment.  Please make sure when you call that you mention that you are scheduling your Annual Wellness Visit with the clinical pharmacist so that the appointment may be made for the proper length of time.      Continue current medications. Continue good therapeutic lifestyle changes which include good diet and exercise. Fall precautions discussed with patient. If an FOBT was given today- please return it to our front desk. If you are over 34 years old - you may need Prevnar 56 or the adult Pneumonia vaccine.  **Flu shots are available--- please call and schedule a FLU-CLINIC appointment**  After your visit with [place] today you will receive a survey in the mail or online from [employer] regarding your care with [place]. Please take a moment to fill this out. Your feedback is very  important to [place] as you can help [place] better understand your patient needs as well as improve your experience and satisfaction. WE CARE ABOUT YOU!!!   Return the FOBT Do not forget to get your colonoscopy 5 years after the last one which would be 2022 Drink plenty of fluids and stay well-hydrated Use Flonase regularly 1-2 sprays each nostril at bedtime and Astelin or Astepro 1 spray each nostril at bedtime and nasal saline frequently during the day Keep the house as cool as possible and do not use any overhead fans Use a coolmist humidifier in the wintertime The patient was informed that if the snoring continues and the treatment for head congestion and rhinitis do not help that he should call us back so we can arrange to have a sleep apnea evaluation

## 2017-01-29 NOTE — Progress Notes (Signed)
Subjective:    Patient ID: Austin Clark, male    DOB: 1951/01/19, 66 y.o.   MRN: [id number]  HPI Patient is here today for annual wellness exam and follow up of chronic medical problems which includes hyperlipidemia. He is taking medication regularly.  She today complains of snoring issues and sinus congestion.  He will get lab work today and a urinalysis today.  The patient is doing well overall.  He does snore at night according to his wife and does have a lot of sinus and congestion issues.  He denies any chest pain or shortness of breath and stays active physically.  He is retired now and enjoying it.  He denies any trouble with nausea vomiting blood in the stool or black tarry bowel movements or change in bowel habits.  He did have a colonoscopy last year and his father had colon cancer and this will be repeated again in 5 years which would be 2022.  He is passing his water without problems.     Patient Active Problem List   Diagnosis Date Noted  . Abdominal aortic atherosclerosis ([place]) 07/25/2015  . Family history of colon cancer 11/09/2013  . Hyperlipidemia 05/31/2012  . BPH (benign prostatic hyperplasia) 05/31/2012  . History of hypothyroidism 05/31/2012  . Testosterone deficiency 05/31/2012  . Vitamin D deficiency 05/31/2012  . Fatigue 05/31/2012   Outpatient Encounter Medications as of 01/29/2017  Medication Sig  . aspirin (LONGS ADULT LOW STRENGTH ASA) 81 MG EC tablet Take 81 mg by mouth daily.    . dorzolamide-timolol (COSOPT) 22.3-6.8 MG/ML ophthalmic solution INSTILL 1 DROP INTO BOTH EYES TWICE A DAY  . LUMIGAN 0.01 % SOLN 1 DROP IN BOTH EYES AT BEDTIME  . omega-3 acid ethyl esters (LOVAZA) 1 g capsule TAKE 2 CAPSULE BY MOUTH TWICE A DAY AS DIRECTED  . rosuvastatin (CRESTOR) 40 MG tablet Take 40 mg by mouth daily.  . rosuvastatin (CRESTOR) 40 MG tablet TAKE 1 TABLET BY MOUTH EVERY DAY  . Vitamin D, Ergocalciferol, (DRISDOL) 50000 units CAPS capsule TAKE 1 CAPSULE (50,000  UNITS TOTAL) BY MOUTH EVERY 7 (SEVEN) DAYS.  . [DISCONTINUED] niacin (NIASPAN) 1000 MG CR tablet Take 1,000 mg by mouth at bedtime.   No facility-administered encounter medications on file as of 01/29/2017.       Review of Systems  Constitutional: Negative.   HENT: Positive for sinus pressure (snoring).   Eyes: Negative.   Respiratory: Negative.   Cardiovascular: Negative.   Gastrointestinal: Negative.   Endocrine: Negative.   Genitourinary: Negative.   Musculoskeletal: Negative.   Skin: Negative.   Allergic/Immunologic: Negative.   Neurological: Negative.   Hematological: Negative.   Psychiatric/Behavioral: Negative.        Objective:   Physical Exam  Constitutional: He is oriented to person, place, and time. He appears well-developed and well-nourished. No distress.  The patient is pleasant calm and relaxed and smiling and seems to be in good spirits  HENT:  Head: Normocephalic and atraumatic.  Right Ear: External ear normal.  Left Ear: External ear normal.  Mouth/Throat: Oropharynx is clear and moist. No oropharyngeal exudate.  Nasal turbinate congestion bilaterally with left being worse than right  Eyes: Conjunctivae and EOM are normal. Pupils are equal, round, and reactive to light. Right eye exhibits no discharge. Left eye exhibits no discharge. No scleral icterus.  Patient gets eye exams regularly  Neck: Normal range of motion. Neck supple. No thyromegaly present.  No bruits thyromegaly or anterior cervical adenopathy  Cardiovascular:  Normal rate, regular rhythm, normal heart sounds and intact distal pulses.  No murmur heard. The heart is regular at 60/min with good pedal pulses  Pulmonary/Chest: Effort normal and breath sounds normal. No respiratory distress. He has no wheezes. He has no rales. He exhibits no tenderness.  Clear anteriorly and posteriorly and no axillary adenopathy and no chest wall masses  Abdominal: Soft. Bowel sounds are normal. He exhibits no  mass. There is no tenderness. There is no rebound and no guarding.  No liver or spleen enlargement no bruits no masses and normal bowel sounds  Genitourinary: Rectum normal and penis normal.  Genitourinary Comments: The prostate is slightly enlarged but soft and smooth.  There were no lumps or masses.  There were no rectal masses.  External genitalia were within normal limits and there were no inguinal hernias palpable and no inguinal adenopathy.  Musculoskeletal: Normal range of motion. He exhibits no edema.  Lymphadenopathy:    He has no cervical adenopathy.  Neurological: He is alert and oriented to person, place, and time. He has normal reflexes. No cranial nerve deficit.  Skin: Skin is warm and dry. No rash noted.  Psychiatric: He has a normal mood and affect. His behavior is normal. Judgment and thought content normal.  Nursing note and vitals reviewed.   BP 115/60 (BP Location: Right Arm)   Pulse (!) 51   Temp 97.8 F (36.6 C) (Oral)   Ht '5\' 11"'$  (1.803 m)   Wt 202 lb (91.6 kg)   BMI 28.17 kg/m        Assessment & Plan:  1. Annual physical exam -Patient's next colonoscopy will be due in 2022 because of family history of colon cancer -He does have increased snoring issues and head congestion we will follow-up with a possible sleep apnea evaluation if the snoring continues. - BMP8+EGFR - CBC with Differential/Platelet - Lipid panel - VITAMIN D 25 Hydroxy (Vit-D Deficiency, Fractures) - Hepatic function panel - Thyroid Panel With TSH - PSA, total and free - Urinalysis, Complete  2. Benign prostatic hyperplasia, unspecified whether lower urinary tract symptoms present -No symptoms with his prostate enlargement - CBC with Differential/Platelet - PSA, total and free - Urinalysis, Complete  3. Other hyperlipidemia -Continue current treatment and aggressive therapeutic lifestyle changes - CBC with Differential/Platelet - Lipid panel - Hepatic function panel  4. Vitamin  D deficiency -Continue current treatment pending results of lab work - CBC with Differential/Platelet - VITAMIN D 25 Hydroxy (Vit-D Deficiency, Fractures)  5. Abdominal aortic atherosclerosis ([facility]) -Continue Crestor and omega-3 fatty acids pending results of lab work  6. Testosterone deficiency -With this today, no complaints.  7. Family history of colon cancer -Next colonoscopy is due in November 2022 patient is aware of this  8. Non-seasonal allergic rhinitis due to pollen -Patient will keep the house as cool as possible and use nasal saline. -Prescriptions for Flonase and Astelin given today.  No orders of the defined types were placed in this encounter.  Patient Instructions                       Medicare Annual Wellness Visit  [facility] and the medical providers at [facility] strive to bring you the best medical care.  In doing so we not only want to address your current medical conditions and concerns but also to detect new conditions early and prevent illness, disease and health-related problems.    Medicare offers a yearly Wellness  Visit which allows our clinical staff to assess your need for preventative services including immunizations, lifestyle education, counseling to decrease risk of preventable diseases and screening for fall risk and other medical concerns.    This visit is provided free of charge (no copay) for all Medicare recipients. The clinical pharmacists at [facility] have begun to conduct these Wellness Visits which will also include a thorough review of all your medications.    As you primary medical provider recommend that you make an appointment for your Annual Wellness Visit if you have not done so already this year.  You may set up this appointment before you leave today or you may call back ([phone]) and schedule an appointment.  Please make sure when you call that you mention that you are scheduling your  Annual Wellness Visit with the clinical pharmacist so that the appointment may be made for the proper length of time.      Continue current medications. Continue good therapeutic lifestyle changes which include good diet and exercise. Fall precautions discussed with patient. If an FOBT was given today- please return it to our front desk. If you are over 88 years old - you may need Prevnar 29 or the adult Pneumonia vaccine.  **Flu shots are available--- please call and schedule a FLU-CLINIC appointment**  After your visit with [place] today you will receive a survey in the mail or online from [employer] regarding your care with [place]. Please take a moment to fill this out. Your feedback is very important to [place] as you can help [place] better understand your patient needs as well as improve your experience and satisfaction. WE CARE ABOUT YOU!!!   Return the FOBT Do not forget to get your colonoscopy 5 years after the last one which would be 2022 Drink plenty of fluids and stay well-hydrated Use Flonase regularly 1-2 sprays each nostril at bedtime and Astelin or Astepro 1 spray each nostril at bedtime and nasal saline frequently during the day Keep the house as cool as possible and do not use any overhead fans Use a coolmist humidifier in the wintertime The patient was informed that if the snoring continues and the treatment for head congestion and rhinitis do not help that he should call us back so we can arrange to have a sleep apnea evaluation   Arrie Senate MD

## 2017-01-29 NOTE — Addendum Note (Signed)
Addended by: Zannie Cove on: 01/29/2017 09:09 AM   Modules accepted: Orders

## 2017-01-30 LAB — BMP8+EGFR
BUN / CREAT RATIO: 9 — AB (ref 10–24)
BUN: 8 mg/dL (ref 8–27)
CHLORIDE: 106 mmol/L (ref 96–106)
CO2: 25 mmol/L (ref 20–29)
Calcium: 9.5 mg/dL (ref 8.6–10.2)
Creatinine, Ser: 0.93 mg/dL (ref 0.76–1.27)
GFR calc non Af Amer: 86 mL/min/{1.73_m2} (ref >59)
GFR, EST AFRICAN AMERICAN: 99 mL/min/{1.73_m2} (ref >59)
GLUCOSE: 98 mg/dL (ref 65–99)
POTASSIUM: 4.7 mmol/L (ref 3.5–5.2)
Sodium: 144 mmol/L (ref 134–144)

## 2017-01-30 LAB — CBC WITH DIFFERENTIAL/PLATELET
BASOS ABS: 0 10*3/uL (ref 0.0–0.2)
Basos: 1 %
EOS (ABSOLUTE): 0.2 10*3/uL (ref 0.0–0.4)
Eos: 3 %
Hematocrit: 44 % (ref 37.5–51.0)
Hemoglobin: 14.7 g/dL (ref 13.0–17.7)
Immature Grans (Abs): 0 10*3/uL (ref 0.0–0.1)
Immature Granulocytes: 0 %
LYMPHS ABS: 2.1 10*3/uL (ref 0.7–3.1)
Lymphs: 32 %
MCH: 33.7 pg — AB (ref 26.6–33.0)
MCHC: 33.4 g/dL (ref 31.5–35.7)
MCV: 101 fL — ABNORMAL HIGH (ref 79–97)
MONOS ABS: 0.4 10*3/uL (ref 0.1–0.9)
Monocytes: 6 %
NEUTROS ABS: 3.8 10*3/uL (ref 1.4–7.0)
Neutrophils: 58 %
PLATELETS: 175 10*3/uL (ref 150–379)
RBC: 4.36 x10E6/uL (ref 4.14–5.80)
RDW: 13.2 % (ref 12.3–15.4)
WBC: 6.6 10*3/uL (ref 3.4–10.8)

## 2017-01-30 LAB — LIPID PANEL
CHOLESTEROL TOTAL: 115 mg/dL (ref 100–199)
Chol/HDL Ratio: 3.7 ratio (ref 0.0–5.0)
HDL: 31 mg/dL — ABNORMAL LOW (ref >39)
LDL CALC: 64 mg/dL (ref 0–99)
Triglycerides: 101 mg/dL (ref 0–149)
VLDL Cholesterol Cal: 20 mg/dL (ref 5–40)

## 2017-01-30 LAB — HEPATIC FUNCTION PANEL
ALBUMIN: 4.6 g/dL (ref 3.6–4.8)
ALK PHOS: 63 IU/L (ref 39–117)
ALT: 22 IU/L (ref 0–44)
AST: 24 IU/L (ref 0–40)
BILIRUBIN TOTAL: 0.3 mg/dL (ref 0.0–1.2)
Bilirubin, Direct: 0.09 mg/dL (ref 0.00–0.40)
TOTAL PROTEIN: 6.8 g/dL (ref 6.0–8.5)

## 2017-01-30 LAB — THYROID PANEL WITH TSH
FREE THYROXINE INDEX: 2.2 (ref 1.2–4.9)
T3 Uptake Ratio: 29 % (ref 24–39)
T4 TOTAL: 7.6 ug/dL (ref 4.5–12.0)
TSH: 3.25 u[IU]/mL (ref 0.450–4.500)

## 2017-01-30 LAB — PSA, TOTAL AND FREE
PROSTATE SPECIFIC AG, SERUM: 0.6 ng/mL (ref 0.0–4.0)
PSA, Free Pct: 31.7 %
PSA, Free: 0.19 ng/mL

## 2017-01-30 LAB — VITAMIN D 25 HYDROXY (VIT D DEFICIENCY, FRACTURES): Vit D, 25-Hydroxy: 53 ng/mL (ref 30.0–100.0)

## 2017-02-01 ENCOUNTER — Other Ambulatory Visit: Payer: PPO

## 2017-02-01 DIAGNOSIS — Z1211 Encounter for screening for malignant neoplasm of colon: Secondary | ICD-10-CM | POA: Diagnosis not present

## 2017-02-04 ENCOUNTER — Telehealth: Payer: Self-pay | Admitting: Family Medicine

## 2017-02-04 NOTE — Telephone Encounter (Signed)
Pt aware - results not back yet

## 2017-02-05 LAB — FECAL OCCULT BLOOD, IMMUNOCHEMICAL: Fecal Occult Bld: NEGATIVE

## 2017-04-02 DIAGNOSIS — D485 Neoplasm of uncertain behavior of skin: Secondary | ICD-10-CM | POA: Diagnosis not present

## 2017-04-02 DIAGNOSIS — H401133 Primary open-angle glaucoma, bilateral, severe stage: Secondary | ICD-10-CM | POA: Diagnosis not present

## 2017-04-05 ENCOUNTER — Telehealth: Payer: Self-pay

## 2017-04-05 DIAGNOSIS — L821 Other seborrheic keratosis: Secondary | ICD-10-CM | POA: Diagnosis not present

## 2017-04-05 DIAGNOSIS — D23112 Other benign neoplasm of skin of right lower eyelid, including canthus: Secondary | ICD-10-CM | POA: Diagnosis not present

## 2017-04-05 DIAGNOSIS — Z961 Presence of intraocular lens: Secondary | ICD-10-CM | POA: Diagnosis not present

## 2017-04-05 NOTE — Telephone Encounter (Signed)
Patient is requesting to see Dr. Hilarie Fredrickson. He was referred by his pcp Dr. Laurance Flatten. He's been a patient of [facility] with Dr. Amedeo Plenty who is leaving the practice. He hasn't been seen since 2017, therefore records do not need to be reviewed as far as the one year time frame. He would like to continue care as advised by his pcp. He will have his records sent and would like for [place] to let him know when he will be due for a repeat colon. He thinks its not for a couple of years.

## 2017-04-16 ENCOUNTER — Other Ambulatory Visit: Payer: Self-pay | Admitting: Family Medicine

## 2017-04-22 ENCOUNTER — Other Ambulatory Visit: Payer: Self-pay | Admitting: Family Medicine

## 2017-04-23 NOTE — Telephone Encounter (Signed)
Dr.Pyrtle reviewed records and accepted patient. Noted "hx of adenoma, recall would be 12/2020". Patient has been notified of this and recall has been put in system.

## 2017-07-29 DIAGNOSIS — Z961 Presence of intraocular lens: Secondary | ICD-10-CM | POA: Diagnosis not present

## 2017-07-29 DIAGNOSIS — H26492 Other secondary cataract, left eye: Secondary | ICD-10-CM | POA: Diagnosis not present

## 2017-07-29 DIAGNOSIS — H401133 Primary open-angle glaucoma, bilateral, severe stage: Secondary | ICD-10-CM | POA: Diagnosis not present

## 2017-08-07 ENCOUNTER — Other Ambulatory Visit: Payer: Self-pay | Admitting: Family Medicine

## 2017-08-21 ENCOUNTER — Other Ambulatory Visit: Payer: Self-pay | Admitting: Family Medicine

## 2017-08-23 NOTE — Telephone Encounter (Signed)
Last Vit D 01/29/17   53.0  DWM

## 2017-09-02 ENCOUNTER — Other Ambulatory Visit: Payer: Self-pay | Admitting: Family Medicine

## 2017-09-02 NOTE — Telephone Encounter (Signed)
Last lipid 01/29/17

## 2017-09-09 ENCOUNTER — Other Ambulatory Visit: Payer: Self-pay | Admitting: Family Medicine

## 2017-09-09 NOTE — Telephone Encounter (Signed)
Last Vit D 01/29/17  53.0

## 2017-09-30 ENCOUNTER — Telehealth: Payer: Self-pay | Admitting: Family Medicine

## 2017-10-23 ENCOUNTER — Ambulatory Visit (INDEPENDENT_AMBULATORY_CARE_PROVIDER_SITE_OTHER): Payer: PPO | Admitting: Physician Assistant

## 2017-10-23 ENCOUNTER — Encounter: Payer: Self-pay | Admitting: Physician Assistant

## 2017-10-23 VITALS — BP 107/60 | HR 52 | Temp 97.3°F | Ht 71.0 in | Wt 200.4 lb

## 2017-10-23 DIAGNOSIS — S30861A Insect bite (nonvenomous) of abdominal wall, initial encounter: Secondary | ICD-10-CM | POA: Diagnosis not present

## 2017-10-23 DIAGNOSIS — W57XXXD Bitten or stung by nonvenomous insect and other nonvenomous arthropods, subsequent encounter: Secondary | ICD-10-CM

## 2017-10-23 DIAGNOSIS — W57XXXA Bitten or stung by nonvenomous insect and other nonvenomous arthropods, initial encounter: Secondary | ICD-10-CM | POA: Diagnosis not present

## 2017-10-23 MED ORDER — DOXYCYCLINE HYCLATE 100 MG PO TABS: 100.0000 mg | ORAL_TABLET | Freq: Two times a day (BID) | ORAL | 0 refills | Status: DC

## 2017-10-23 NOTE — Progress Notes (Signed)
Over the past summer the patient has had multiple tick bites.  In the past week he has had 2.  One on his abdomen and one behind his knee.  He is unsure how long they were there possibly greater than a day.  They were deer ticks.  He also has had significant itching with it.  The one that was on his lateral abdomen itched for about 1 month.    BP 107/60   Pulse (!) 52   Temp (!) 97.3 F (36.3 C) (Oral)   Ht 5\' 11"  (1.803 m)   Wt 200 lb 6.4 oz (90.9 kg)   BMI 27.95 kg/m    Subjective:    Patient ID: Austin Clark, male    DOB: April 15, 1950, 67 y.o.   MRN: [id number]  HPI: Austin Clark is a 67 y.o. male presenting on 10/23/2017 for Insect Bite (Tick, Lower left leg, abdomen, left abdomen)    Past Medical History:  Diagnosis Date  . BPH (benign prostatic hypertrophy)   . Colon polyps   . Cough   . Glaucoma 1993  . Hyperlipidemia   . Hypothyroid   . Knee pain   . Pulmonary nodule   . Tobacco use    Relevant past medical, surgical, family and social history reviewed and updated as indicated. Interim medical history since our last visit reviewed. Allergies and medications reviewed and updated. DATA REVIEWED: CHART IN EPIC  Family History reviewed for pertinent findings.  Review of Systems  Constitutional: Positive for fatigue. Negative for appetite change and fever.  HENT: Negative.   Eyes: Negative.  Negative for pain and visual disturbance.  Respiratory: Negative.  Negative for cough, chest tightness, shortness of breath and wheezing.   Cardiovascular: Negative.  Negative for chest pain, palpitations and leg swelling.  Gastrointestinal: Negative.  Negative for abdominal pain, diarrhea, nausea and vomiting.  Endocrine: Negative.   Genitourinary: Negative.   Musculoskeletal: Negative.   Skin: Positive for color change. Negative for rash.  Neurological: Negative.  Negative for weakness, numbness and headaches.  Psychiatric/Behavioral: Negative.     Allergies as of 10/23/2017     Reactions   Penicillins Rash      Medication List        Accurate as of 10/23/17 11:37 AM. Always use your most recent med list.          azelastine 0.1 % nasal spray Commonly known as:  ASTELIN Place 1 spray into both nostrils daily. Use in each nostril as directed   dorzolamide-timolol 22.3-6.8 MG/ML ophthalmic solution Commonly known as:  COSOPT INSTILL 1 DROP INTO BOTH EYES TWICE A DAY   doxycycline 100 MG tablet Commonly known as:  VIBRA-TABS Take 1 tablet (100 mg total) by mouth 2 (two) times daily. 1 po bid   fluticasone 50 MCG/ACT nasal spray Commonly known as:  FLONASE Place 2 sprays into both nostrils at bedtime.   LONGS ADULT LOW STRENGTH ASA 81 MG EC tablet Generic drug:  aspirin Take 81 mg by mouth daily.   LUMIGAN 0.01 % Soln Generic drug:  bimatoprost 1 DROP IN BOTH EYES AT BEDTIME   omega-3 acid ethyl esters 1 g capsule Commonly known as:  LOVAZA TAKE 2 CAPSULE BY MOUTH TWICE A DAY AS DIRECTED   rosuvastatin 40 MG tablet Commonly known as:  CRESTOR TAKE 1 TABLET BY MOUTH EVERY DAY   Vitamin D (Ergocalciferol) 50000 units Caps capsule Commonly known as:  DRISDOL TAKE 1 CAPSULE (50,000 UNITS TOTAL) BY MOUTH EVERY 7 (SEVEN)  DAYS.          Objective:    BP 107/60   Pulse (!) 52   Temp (!) 97.3 F (36.3 C) (Oral)   Ht 5\' 11"  (1.803 m)   Wt 200 lb 6.4 oz (90.9 kg)   BMI 27.95 kg/m   Allergies  Allergen Reactions  . Penicillins Rash    Wt Readings from Last 3 Encounters:  10/23/17 200 lb 6.4 oz (90.9 kg)  01/29/17 202 lb (91.6 kg)  07/29/16 205 lb (93 kg)    Physical Exam  Constitutional: He appears well-developed and well-nourished. No distress.  HENT:  Head: Normocephalic and atraumatic.  Eyes: Pupils are equal, round, and reactive to light. Conjunctivae and EOM are normal.  Cardiovascular: Normal rate, regular rhythm and normal heart sounds.  Pulmonary/Chest: Effort normal and breath sounds normal. No respiratory distress.    Skin: Skin is warm and dry. Lesion noted.     Current tick bites on the lower abdomen and posterior right knee, there is erythema around both.  Deep inspection into the one behind the knee does not show any part of the tick left in the wound.  Healing tick bite on left lateral abdomen.  Psychiatric: He has a normal mood and affect. His behavior is normal.  Nursing note and vitals reviewed.       Assessment & Plan:   1. Tick bite, subsequent encounter - doxycycline (VIBRA-TABS) 100 MG tablet; Take 1 tablet (100 mg total) by mouth 2 (two) times daily. 1 po bid  Dispense: 20 tablet; Refill: 0   Continue all other maintenance medications as listed above.  Follow up plan: No follow-ups on file.  Educational handout given for [place] PA-C [facility] [address]  [place], [place] [address] [phone]   10/23/2017, 11:37 AM

## 2017-11-04 ENCOUNTER — Other Ambulatory Visit: Payer: Self-pay | Admitting: Family Medicine

## 2017-11-04 NOTE — Telephone Encounter (Signed)
Last lipid 01/29/17

## 2017-11-29 ENCOUNTER — Other Ambulatory Visit: Payer: Self-pay | Admitting: Family Medicine

## 2017-12-14 DIAGNOSIS — H401133 Primary open-angle glaucoma, bilateral, severe stage: Secondary | ICD-10-CM | POA: Diagnosis not present

## 2017-12-21 ENCOUNTER — Ambulatory Visit: Payer: PPO | Admitting: Family Medicine

## 2018-01-10 DIAGNOSIS — H401133 Primary open-angle glaucoma, bilateral, severe stage: Secondary | ICD-10-CM | POA: Diagnosis not present

## 2018-01-11 ENCOUNTER — Other Ambulatory Visit: Payer: PPO

## 2018-01-11 DIAGNOSIS — I700 Atherosclerosis of aorta: Secondary | ICD-10-CM | POA: Diagnosis not present

## 2018-01-11 DIAGNOSIS — N400 Enlarged prostate without lower urinary tract symptoms: Secondary | ICD-10-CM

## 2018-01-11 DIAGNOSIS — E7849 Other hyperlipidemia: Secondary | ICD-10-CM | POA: Diagnosis not present

## 2018-01-11 DIAGNOSIS — E559 Vitamin D deficiency, unspecified: Secondary | ICD-10-CM

## 2018-01-11 DIAGNOSIS — E349 Endocrine disorder, unspecified: Secondary | ICD-10-CM | POA: Diagnosis not present

## 2018-01-12 ENCOUNTER — Encounter: Payer: Self-pay | Admitting: Family Medicine

## 2018-01-12 ENCOUNTER — Ambulatory Visit (INDEPENDENT_AMBULATORY_CARE_PROVIDER_SITE_OTHER): Payer: PPO

## 2018-01-12 ENCOUNTER — Ambulatory Visit (INDEPENDENT_AMBULATORY_CARE_PROVIDER_SITE_OTHER): Payer: PPO | Admitting: Family Medicine

## 2018-01-12 VITALS — BP 108/56 | HR 67 | Temp 98.4°F | Ht 71.0 in | Wt 197.0 lb

## 2018-01-12 DIAGNOSIS — I700 Atherosclerosis of aorta: Secondary | ICD-10-CM

## 2018-01-12 DIAGNOSIS — N400 Enlarged prostate without lower urinary tract symptoms: Secondary | ICD-10-CM | POA: Diagnosis not present

## 2018-01-12 DIAGNOSIS — E7849 Other hyperlipidemia: Secondary | ICD-10-CM | POA: Diagnosis not present

## 2018-01-12 DIAGNOSIS — E559 Vitamin D deficiency, unspecified: Secondary | ICD-10-CM

## 2018-01-12 DIAGNOSIS — E349 Endocrine disorder, unspecified: Secondary | ICD-10-CM

## 2018-01-12 DIAGNOSIS — Z0000 Encounter for general adult medical examination without abnormal findings: Secondary | ICD-10-CM

## 2018-01-12 LAB — URINALYSIS, COMPLETE
Bilirubin, UA: NEGATIVE
Glucose, UA: NEGATIVE
Ketones, UA: NEGATIVE
LEUKOCYTES UA: NEGATIVE
Nitrite, UA: NEGATIVE
PH UA: 5 (ref 5.0–7.5)
Protein, UA: NEGATIVE
RBC, UA: NEGATIVE
Specific Gravity, UA: 1.025 (ref 1.005–1.030)
Urobilinogen, Ur: 1 mg/dL (ref 0.2–1.0)

## 2018-01-12 LAB — HEPATIC FUNCTION PANEL
ALT: 22 IU/L (ref 0–44)
AST: 24 IU/L (ref 0–40)
Albumin: 4.5 g/dL (ref 3.6–4.8)
Alkaline Phosphatase: 63 IU/L (ref 39–117)
Bilirubin Total: 0.5 mg/dL (ref 0.0–1.2)
Bilirubin, Direct: 0.14 mg/dL (ref 0.00–0.40)
Total Protein: 6.2 g/dL (ref 6.0–8.5)

## 2018-01-12 LAB — MICROSCOPIC EXAMINATION
Bacteria, UA: NONE SEEN
Epithelial Cells (non renal): NONE SEEN /hpf (ref 0–10)
RBC, UA: NONE SEEN /hpf (ref 0–2)
Renal Epithel, UA: NONE SEEN /hpf
WBC, UA: NONE SEEN /hpf (ref 0–5)

## 2018-01-12 LAB — CBC WITH DIFFERENTIAL/PLATELET
BASOS: 1 %
Basophils Absolute: 0 10*3/uL (ref 0.0–0.2)
EOS (ABSOLUTE): 0.2 10*3/uL (ref 0.0–0.4)
Eos: 3 %
HEMOGLOBIN: 14.2 g/dL (ref 13.0–17.7)
Hematocrit: 43.3 % (ref 37.5–51.0)
IMMATURE GRANS (ABS): 0 10*3/uL (ref 0.0–0.1)
IMMATURE GRANULOCYTES: 0 %
LYMPHS: 38 %
Lymphocytes Absolute: 1.8 10*3/uL (ref 0.7–3.1)
MCH: 32.9 pg (ref 26.6–33.0)
MCHC: 32.8 g/dL (ref 31.5–35.7)
MCV: 100 fL — AB (ref 79–97)
MONOCYTES: 8 %
Monocytes Absolute: 0.4 10*3/uL (ref 0.1–0.9)
NEUTROS ABS: 2.4 10*3/uL (ref 1.4–7.0)
NEUTROS PCT: 50 %
Platelets: 136 10*3/uL — ABNORMAL LOW (ref 150–450)
RBC: 4.32 x10E6/uL (ref 4.14–5.80)
RDW: 12.9 % (ref 12.3–15.4)
WBC: 4.8 10*3/uL (ref 3.4–10.8)

## 2018-01-12 LAB — LIPID PANEL
CHOLESTEROL TOTAL: 128 mg/dL (ref 100–199)
Chol/HDL Ratio: 3.6 ratio (ref 0.0–5.0)
HDL: 36 mg/dL — AB (ref >39)
LDL CALC: 69 mg/dL (ref 0–99)
Triglycerides: 113 mg/dL (ref 0–149)
VLDL CHOLESTEROL CAL: 23 mg/dL (ref 5–40)

## 2018-01-12 LAB — BMP8+EGFR
BUN/Creatinine Ratio: 12 (ref 10–24)
BUN: 13 mg/dL (ref 8–27)
CO2: 24 mmol/L (ref 20–29)
CREATININE: 1.07 mg/dL (ref 0.76–1.27)
Calcium: 9.3 mg/dL (ref 8.6–10.2)
Chloride: 104 mmol/L (ref 96–106)
GFR, EST AFRICAN AMERICAN: 83 mL/min/{1.73_m2} (ref >59)
GFR, EST NON AFRICAN AMERICAN: 72 mL/min/{1.73_m2} (ref >59)
Glucose: 106 mg/dL — ABNORMAL HIGH (ref 65–99)
Potassium: 4.3 mmol/L (ref 3.5–5.2)
Sodium: 142 mmol/L (ref 134–144)

## 2018-01-12 LAB — PSA, TOTAL AND FREE
PROSTATE SPECIFIC AG, SERUM: 0.5 ng/mL (ref 0.0–4.0)
PSA FREE PCT: 32 %
PSA FREE: 0.16 ng/mL

## 2018-01-12 LAB — VITAMIN D 25 HYDROXY (VIT D DEFICIENCY, FRACTURES): Vit D, 25-Hydroxy: 51.3 ng/mL (ref 30.0–100.0)

## 2018-01-12 MED ORDER — ICOSAPENT ETHYL 1 G PO CAPS: 2.0000 | ORAL_CAPSULE | Freq: Two times a day (BID) | ORAL | 3 refills | Status: DC

## 2018-01-12 MED ORDER — OMEGA-3-ACID ETHYL ESTERS 1 G PO CAPS: ORAL_CAPSULE | ORAL | 3 refills | Status: DC

## 2018-01-12 MED ORDER — ROSUVASTATIN CALCIUM 40 MG PO TABS: 40.0000 mg | ORAL_TABLET | Freq: Every day | ORAL | 3 refills | Status: DC

## 2018-01-12 MED ORDER — VITAMIN D (ERGOCALCIFEROL) 1.25 MG (50000 UNIT) PO CAPS: 50000.0000 [IU] | ORAL_CAPSULE | ORAL | 3 refills | Status: DC

## 2018-01-12 NOTE — Progress Notes (Signed)
Subjective:    Patient ID: Austin Clark, male    DOB: 20-Aug-1950, 67 y.o.   MRN: [id number]  HPI Patient is here today for annual wellness exam and follow up of chronic medical problems which includes hyperlipidemia. He is taking medication regularly.  Patient comes in today for complete physical.  He is requesting refills on his Crestor and omega-3 fatty acids.  As usual he has no complaints.  He has had blood work done and the PSA remains stable at 0.5.  This is at the low end of the normal range.  The blood sugar was slightly elevated at 106 and the creatinine was normal as well as were all of the electrolytes.  The CBC has a normal white blood cell count a good and stable hemoglobin at 14.2 and a platelet count that is slightly decreased this time at 136,000.  All cholesterol numbers were traditional lipid testing were good except except the HDL or good cholesterol remains low.  The LDL-C was excellent at 69.  Triglycerides are good at 113.  Vitamin D level was good at 51.3.  All liver function tests were within normal limits.  Patient has a history of abdominal aortic atherosclerosis BPH family history of colon cancer hypothyroidism hyperlipidemia and testosterone deficiency and vitamin D deficiency.  He is taking Lovaza Crestor and vitamin D 50,000 units weekly.  The patient is pleasant and calm today.  He just had an eye exam by Dr. Carolynn Sayers.  He denies any chest pain pressure tightness or shortness of breath.  He only has palpitations or feels a fast heartbeat when he is exerting himself.  He denies any trouble with swallowing heartburn indigestion nausea vomiting diarrhea blood in the stool black tarry bowel movements or change in bowel habits.  He is passing his water without problems.  His father had colon cancer and the patient is not due his next colonoscopy and ~November 2022.     Patient Active Problem List   Diagnosis Date Noted  . Abdominal aortic atherosclerosis ([place]) 07/25/2015  .  Family history of colon cancer 11/09/2013  . Hyperlipidemia 05/31/2012  . BPH (benign prostatic hyperplasia) 05/31/2012  . History of hypothyroidism 05/31/2012  . Testosterone deficiency 05/31/2012  . Vitamin D deficiency 05/31/2012  . Fatigue 05/31/2012   Outpatient Encounter Medications as of 01/12/2018  Medication Sig  . aspirin (LONGS ADULT LOW STRENGTH ASA) 81 MG EC tablet Take 81 mg by mouth daily.    . dorzolamide-timolol (COSOPT) 22.3-6.8 MG/ML ophthalmic solution INSTILL 1 DROP INTO BOTH EYES TWICE A DAY  . omega-3 acid ethyl esters (LOVAZA) 1 g capsule TAKE 2 CAPSULE BY MOUTH TWICE A DAY AS DIRECTED  . rosuvastatin (CRESTOR) 40 MG tablet TAKE 1 TABLET BY MOUTH EVERY DAY  . Vitamin D, Ergocalciferol, (DRISDOL) 50000 units CAPS capsule TAKE 1 CAPSULE (50,000 UNITS TOTAL) BY MOUTH EVERY 7 (SEVEN) DAYS.  . [DISCONTINUED] azelastine (ASTELIN) 0.1 % nasal spray Place 1 spray into both nostrils daily. Use in each nostril as directed  . [DISCONTINUED] doxycycline (VIBRA-TABS) 100 MG tablet Take 1 tablet (100 mg total) by mouth 2 (two) times daily. 1 po bid  . [DISCONTINUED] fluticasone (FLONASE) 50 MCG/ACT nasal spray Place 2 sprays into both nostrils at bedtime.  . [DISCONTINUED] LUMIGAN 0.01 % SOLN 1 DROP IN BOTH EYES AT BEDTIME   No facility-administered encounter medications on file as of 01/12/2018.      Review of Systems  Constitutional: Negative.   HENT: Negative.   Eyes:  Negative.   Respiratory: Negative.   Cardiovascular: Negative.   Gastrointestinal: Negative.   Endocrine: Negative.   Genitourinary: Negative.   Musculoskeletal: Negative.   Skin: Negative.   Allergic/Immunologic: Negative.   Neurological: Negative.   Hematological: Negative.   Psychiatric/Behavioral: Negative.        Objective:   Physical Exam  Constitutional: He is oriented to person, place, and time. He appears well-developed and well-nourished. No distress.  The patient is calm pleasant and  alert.  He is currently retired and enjoying his retirement and staying active physically.  HENT:  Head: Normocephalic and atraumatic.  Right Ear: External ear normal.  Left Ear: External ear normal.  Mouth/Throat: Oropharynx is clear and moist. No oropharyngeal exudate.  Nasal turbinate congestion and swelling bilaterally but patient is not bothered by this.  Eyes: Pupils are equal, round, and reactive to light. Conjunctivae and EOM are normal. Right eye exhibits no discharge. Left eye exhibits no discharge. No scleral icterus.  This week and everything was stable.  Neck: Normal range of motion. Neck supple. No thyromegaly present.  No thyromegaly anterior cervical adenopathy or bruits  Cardiovascular: Normal rate, regular rhythm, normal heart sounds and intact distal pulses.  No murmur heard. Heart was regular at 72/min with good pedal pulses and no edema  Pulmonary/Chest: Effort normal and breath sounds normal. He has no wheezes. He has no rales. He exhibits no tenderness.  Clear anteriorly and posteriorly and no axillary adenopathy or chest wall masses  Abdominal: Soft. Bowel sounds are normal. He exhibits no mass. There is no tenderness.  No abdominal tenderness either in the epigastrium or suprapubic area.  No liver or spleen enlargement.  No masses and no bruits.  Genitourinary: Rectum normal and penis normal.  Genitourinary Comments: The prostate is enlarged but soft and smooth without lumps or masses.  There were no rectal masses.  The external genitalia were within normal limits and no inguinal hernias were palpated and there was no inguinal adenopathy.  Musculoskeletal: Normal range of motion. He exhibits no edema.  Lymphadenopathy:    He has no cervical adenopathy.  Neurological: He is alert and oriented to person, place, and time. He has normal reflexes. No cranial nerve deficit.  Skin: Skin is warm and dry. No rash noted.  Psychiatric: He has a normal mood and affect. His  behavior is normal. Judgment and thought content normal.  Nursing note and vitals reviewed.  BP (!) 108/56 (BP Location: Left Arm)   Pulse 67   Temp 98.4 F (36.9 C) (Oral)   Ht 5\' 11"  (1.803 m)   Wt 197 lb (89.4 kg)   BMI 27.48 kg/m    EKG with results pending     Assessment & Plan:  1. Annual physical exam -Patient will be due next colonoscopy and November 2022. -He is up-to-date on his FOBT checks. - DG Chest 2 View; Future - EKG 12-Lead - Urinalysis, Complete  2. Other hyperlipidemia -Continue with Crestor and with omega-3 fatty acids and try to change to the new or omega-3 fatty acid which is vascepa - DG Chest 2 View; Future - EKG 12-Lead  3. Vitamin D deficiency -Continue with vitamin D replacement  4. Benign prostatic hyperplasia, unspecified whether lower urinary tract symptoms present -Patient is having no symptoms with voiding today. - Urinalysis, Complete  5. Testosterone deficiency -He is currently not on testosterone replacement.  6. Abdominal aortic atherosclerosis ([place]) -Continue with Crestor omega-3 fatty acids and as aggressive therapeutic lifestyle changes as  possible. - DG Chest 2 View; Future  Meds ordered this encounter  Medications  . omega-3 acid ethyl esters (LOVAZA) 1 g capsule    Sig: TAKE 2 CAPSULE BY MOUTH TWICE A DAY AS DIRECTED    Dispense:  360 capsule    Refill:  3    Place on file for future refills  . Vitamin D, Ergocalciferol, (DRISDOL) 1.25 MG (50000 UT) CAPS capsule    Sig: Take 1 capsule (50,000 Units total) by mouth every 7 (seven) days.    Dispense:  12 capsule    Refill:  3    Place on file for future refills  . rosuvastatin (CRESTOR) 40 MG tablet    Sig: Take 1 tablet (40 mg total) by mouth daily.    Dispense:  90 tablet    Refill:  3    Place on file for future refills   Patient Instructions  Stay active Follow aggressive therapeutic lifestyle changes to achieve weight loss and keep cholesterol low Drink  plenty of water and stay well-hydrated Do not forget to get your colonoscopy in November 2022 with Dr. Billie Lade Continue to get eye exams as recommended by ophthalmologist Crestor and vitamin D replacement  Arrie Senate MD

## 2018-01-12 NOTE — Patient Instructions (Signed)
Stay active Follow aggressive therapeutic lifestyle changes to achieve weight loss and keep cholesterol low Drink plenty of water and stay well-hydrated Do not forget to get your colonoscopy in November 2022 with Dr. Billie Lade Continue to get eye exams as recommended by ophthalmologist Crestor and vitamin D replacement

## 2018-01-12 NOTE — Addendum Note (Signed)
Addended by: Zannie Cove on: 01/12/2018 03:34 PM   Modules accepted: Orders

## 2018-01-17 DIAGNOSIS — N400 Enlarged prostate without lower urinary tract symptoms: Secondary | ICD-10-CM | POA: Diagnosis not present

## 2018-01-17 LAB — URINALYSIS, COMPLETE
Bilirubin, UA: NEGATIVE
Glucose, UA: NEGATIVE
Ketones, UA: NEGATIVE
Leukocytes, UA: NEGATIVE
Nitrite, UA: NEGATIVE
PH UA: 5 (ref 5.0–7.5)
Protein, UA: NEGATIVE
RBC, UA: NEGATIVE
Specific Gravity, UA: 1.025 (ref 1.005–1.030)
UUROB: 1 mg/dL (ref 0.2–1.0)

## 2018-01-17 LAB — MICROSCOPIC EXAMINATION
Bacteria, UA: NONE SEEN
Epithelial Cells (non renal): NONE SEEN /hpf (ref 0–10)
RBC, UA: NONE SEEN /hpf (ref 0–2)
Renal Epithel, UA: NONE SEEN /hpf
WBC UA: NONE SEEN /HPF (ref 0–5)

## 2018-01-17 NOTE — Addendum Note (Signed)
Addended by: Zannie Cove on: 01/17/2018 02:20 PM   Modules accepted: Orders

## 2018-02-25 DIAGNOSIS — H401133 Primary open-angle glaucoma, bilateral, severe stage: Secondary | ICD-10-CM | POA: Diagnosis not present

## 2018-03-29 DIAGNOSIS — Z961 Presence of intraocular lens: Secondary | ICD-10-CM | POA: Diagnosis not present

## 2018-03-29 DIAGNOSIS — H401130 Primary open-angle glaucoma, bilateral, stage unspecified: Secondary | ICD-10-CM | POA: Diagnosis not present

## 2018-04-21 DIAGNOSIS — H401113 Primary open-angle glaucoma, right eye, severe stage: Secondary | ICD-10-CM | POA: Diagnosis not present

## 2018-06-01 ENCOUNTER — Encounter: Payer: Self-pay | Admitting: Family Medicine

## 2018-07-05 ENCOUNTER — Ambulatory Visit (INDEPENDENT_AMBULATORY_CARE_PROVIDER_SITE_OTHER): Payer: PPO | Admitting: *Deleted

## 2018-07-05 ENCOUNTER — Other Ambulatory Visit: Payer: Self-pay

## 2018-07-05 DIAGNOSIS — Z0000 Encounter for general adult medical examination without abnormal findings: Secondary | ICD-10-CM | POA: Diagnosis not present

## 2018-07-05 NOTE — Progress Notes (Signed)
MEDICARE ANNUAL WELLNESS VISIT  07/05/2018  Telephone Visit Disclaimer This Medicare AWV was conducted by telephone due to national recommendations for restrictions regarding the COVID-19 Pandemic (e.g. social distancing).  I verified, using two identifiers, that I am speaking with Austin Clark or their authorized healthcare agent. I discussed the limitations, risks, security, and privacy concerns of performing an evaluation and management service by telephone and the potential availability of an in-person appointment in the future. The patient expressed understanding and agreed to proceed.   Subjective:  Austin Clark is a 68 y.o. male patient of Chipper Herb, MD who had a Medicare Annual Wellness Visit today via telephone. Austin Clark is Retired and lives with their spouse. he has 2 children. he reports that he is socially active and does interact with friends/family regularly. he is minimally physically active and enjoys playing golf twice a week.  Patient Care Team: Chipper Herb, MD as PCP - General (Family Medicine) Clent Jacks, MD (Ophthalmology) Irine Seal, MD (Urology)  Advanced Directives 07/05/2018  Does Patient Have a Medical Advance Directive? No  Would patient like information on creating a medical advance directive? No - Patient declined    Hospital Utilization Over the Past 12 Months: # of hospitalizations or ER visits: 0 # of surgeries: 1  Review of Systems    Patient reports that his overall health is unchanged compared to last year.  Patient Reported Readings (BP, Pulse, CBG, Weight, etc) none  Review of Systems: No complaints  All other systems negative.  Pain Assessment Pain : No/denies pain     Current Medications & Allergies (verified) Allergies as of 07/05/2018      Reactions   Penicillins Rash      Medication List       Accurate as of Jul 05, 2018  3:09 PM. If you have any questions, ask your nurse or doctor.        STOP taking these  medications   Icosapent Ethyl 1 g Caps Commonly known as:  Vascepa   Longs Adult Low Strength ASA 81 MG EC tablet Generic drug:  aspirin     TAKE these medications   dorzolamide-timolol 22.3-6.8 MG/ML ophthalmic solution Commonly known as:  COSOPT Place 1 drop into the left eye.   omega-3 acid ethyl esters 1 g capsule Commonly known as:  LOVAZA TAKE 2 CAPSULE BY MOUTH TWICE A DAY AS DIRECTED   rosuvastatin 40 MG tablet Commonly known as:  CRESTOR Take 1 tablet (40 mg total) by mouth daily.   Vitamin D (Ergocalciferol) 1.25 MG (50000 UT) Caps capsule Commonly known as:  DRISDOL Take 1 capsule (50,000 Units total) by mouth every 7 (seven) days.   Vyzulta 0.024 % Soln Generic drug:  Latanoprostene Bunod       History (reviewed): Past Medical History:  Diagnosis Date  . BPH (benign prostatic hypertrophy)   . Colon polyps   . Cough   . Glaucoma 1993  . Hyperlipidemia   . Hypothyroid   . Knee pain   . Pulmonary nodule   . Tobacco use    Past Surgical History:  Procedure Laterality Date  . EYE SURGERY Bilateral    cataract  . knee surgery Right    "cleaned"  . LASER SURGERY     GLAUCOMA   . TONSILLECTOMY     Family History  Problem Relation Age of Onset  . Cancer Father        colon cancer   Social History  Socioeconomic History  . Marital status: Married    Spouse name: [name]  . Number of children: 2  . Years of education: Not on file  . Highest education level: High school graduate  Occupational History  . Occupation: fulltime    Employer: [employer]  . Occupation: Retired  Scientific laboratory technician  . Financial resource strain: Not hard at all  . Food insecurity:    Worry: Never true    Inability: Never true  . Transportation needs:    Medical: No    Non-medical: No  Tobacco Use  . Smoking status: Former Smoker    Packs/day: 1.00    Types: Cigarettes    Start date: 02/17/1967    Last attempt to quit: 02/16/1997    Years since quitting:  21.3  . Smokeless tobacco: Never Used  . Tobacco comment: Rare cigarettes  Substance and Sexual Activity  . Alcohol use: Not Currently    Comment: previous  . Drug use: No  . Sexual activity: Yes    Birth control/protection: None  Lifestyle  . Physical activity:    Days per week: 2 days    Minutes per session: 40 min  . Stress: Not at all  Relationships  . Social connections:    Talks on phone: Twice a week    Gets together: Twice a week    Attends religious service: 1 to 4 times per year    Active member of club or organization: No    Attends meetings of clubs or organizations: Never    Relationship status: Married  Other Topics Concern  . Not on file  Social History Narrative  . Not on file    Activities of Daily Living In your present state of health, do you have any difficulty performing the following activities: 07/05/2018  Hearing? Y  Comment his wife says he has more trouble hearing now   Vision? Y  Comment recently had surgery on his right eye- it is getting better everyday and he follows up with his eye doctor regularly  Difficulty concentrating or making decisions? N  Walking or climbing stairs? N  Dressing or bathing? N  Doing errands, shopping? N  Preparing Food and eating ? N  Using the Toilet? N  In the past six months, have you accidently leaked urine? N  Do you have problems with loss of bowel control? N  Managing your Medications? N  Managing your Finances? N  Housekeeping or managing your Housekeeping? N  Some recent data might be hidden    Patient Literacy How often do you need to have someone help you when you Austin Clark instructions, pamphlets, or other written materials from your doctor or pharmacy?: 1 - Never What is the last grade level you completed in school?: 12th grade  Exercise Current Exercise Habits: Home exercise routine, Type of exercise: walking, Time (Minutes): 40, Frequency (Times/Week): 2, Weekly Exercise (Minutes/Week): 80,  Intensity: Mild, Exercise limited by: None identified  Diet Patient reports consuming 3 meals a day and 0 snack(s) a day Patient reports that his primary diet is: Regular Patient reports that she does have regular access to food.   Depression Screen PHQ 2/9 Scores 07/05/2018 01/12/2018 10/23/2017 01/29/2017 07/29/2016 11/25/2015 10/16/2015  PHQ - 2 Score 0 0 0 0 0 0 0     Fall Risk Fall Risk  07/05/2018 01/12/2018 10/23/2017 01/29/2017 07/29/2016  Falls in the past year? 0 0 No No No     Objective:  Austin Clark seemed alert  and oriented and he participated appropriately during our telephone visit.  Blood Pressure Weight BMI  BP Readings from Last 3 Encounters:  01/12/18 (!) 108/56  10/23/17 107/60  01/29/17 115/60   Wt Readings from Last 3 Encounters:  01/12/18 197 lb (89.4 kg)  10/23/17 200 lb 6.4 oz (90.9 kg)  01/29/17 202 lb (91.6 kg)   BMI Readings from Last 1 Encounters:  01/12/18 27.48 kg/m    *Unable to obtain current vital signs, weight, and BMI due to telephone visit type  Hearing/Vision  . Austin Clark did not seem to have difficulty with hearing/understanding during the telephone conversation . Reports that he has had a formal eye exam by an eye care professional within the past year . Reports that he has not had a formal hearing evaluation within the past year *Unable to fully assess hearing and vision during telephone visit type  Cognitive Function: 6CIT Screen 07/05/2018  What month? 0 points  What time? 0 points  Count back from 20 2 points  Months in reverse 0 points  Repeat phrase 0 points    Normal Cognitive Function Screening: Yes (Normal:0-7, Significant for Dysfunction: >8)  Immunization & Health Maintenance Record Immunization History  Administered Date(s) Administered  . Influenza Split 11/21/2012  . Influenza, High Dose Seasonal PF 12/11/2016, 12/13/2017  . Influenza,inj,Quad PF,6+ Mos 12/05/2013, 11/26/2014, 11/20/2015  . Pneumococcal Conjugate-13  08/22/2014  . Zoster 02/08/2012    Health Maintenance  Topic Date Due  . PNA vac Low Risk Adult (2 of 2 - PPSV23) 09/06/2016  . COLON CANCER SCREENING ANNUAL FOBT  02/01/2018  . TETANUS/TDAP  01/13/2019 (Originally 03/19/2017)  . INFLUENZA VACCINE  09/17/2018  . COLONOSCOPY  12/19/2020  . Hepatitis C Screening  Completed       Assessment  This is a routine wellness examination for Austin Clark.  Health Maintenance: Due or Overdue Health Maintenance Due  Topic Date Due  . PNA vac Low Risk Adult (2 of 2 - PPSV23) 09/06/2016  . COLON CANCER SCREENING ANNUAL FOBT  02/01/2018    Austin Clark does not need a referral for Community Assistance: Care Management:   no Social Work:    no Prescription Assistance:  no Nutrition/Diabetes Education:  no   Plan:  Personalized Goals Goals Addressed            This Visit's Progress   . Increase physical activity       Pt would like to start back at the local [employer] as soon as the COVID-19 is over      Personalized Health Maintenance & Screening Recommendations  Pneumococcal vaccine   Lung Cancer Screening Recommended: no (Low Dose CT Chest recommended if Age 25-80 years, 30 pack-year currently smoking OR have quit w/in past 15 years) Hepatitis C Screening recommended: no HIV Screening recommended: no  Advanced Directives: Written information was not prepared per patient's request.  Referrals & Orders No orders of the defined types were placed in this encounter.   Follow-up Plan . Follow-up with Chipper Herb, MD as planned     I have personally reviewed and noted the following in the patient's chart:   . Medical and social history . Use of alcohol, tobacco or illicit drugs  . Current medications and supplements . Functional ability and status . Nutritional status . Physical activity . Advanced directives . List of other physicians . Hospitalizations, surgeries, and ER visits in previous 12 months . Vitals .  Screenings to include cognitive, depression, and falls .  Referrals and appointments  In addition, I have reviewed and discussed with Austin Clark certain preventive protocols, quality metrics, and best practice recommendations. A written personalized care plan for preventive services as well as general preventive health recommendations is available and can be mailed to the patient at his request.     signature  07/05/2018

## 2018-07-05 NOTE — Patient Instructions (Signed)
Preventive Care 2 Years and Older, Male Preventive care refers to lifestyle choices and visits with your health care provider that can promote health and wellness. What does preventive care include?   A yearly physical exam. This is also called an annual well check.  Dental exams once or twice a year.  Routine eye exams. Ask your health care provider how often you should have your eyes checked.  Personal lifestyle choices, including: ? Daily care of your teeth and gums. ? Regular physical activity. ? Eating a healthy diet. ? Avoiding tobacco and drug use. ? Limiting alcohol use. ? Practicing safe sex. ? Taking low doses of aspirin every day. ? Taking vitamin and mineral supplements as recommended by your health care provider. What happens during an annual well check? The services and screenings done by your health care provider during your annual well check will depend on your age, overall health, lifestyle risk factors, and family history of disease. Counseling Your health care provider may ask you questions about your:  Alcohol use.  Tobacco use.  Drug use.  Emotional well-being.  Home and relationship well-being.  Sexual activity.  Eating habits.  History of falls.  Memory and ability to understand (cognition).  Work and work Statistician. Screening You may have the following tests or measurements:  Height, weight, and BMI.  Blood pressure.  Lipid and cholesterol levels. These may be checked every 5 years, or more frequently if you are over 9 years old.  Skin check.  Lung cancer screening. You may have this screening every year starting at age 57 if you have a 30-pack-year history of smoking and currently smoke or have quit within the past 15 years.  Colorectal cancer screening. All adults should have this screening starting at age 90 and continuing until age 69. You will have tests every 1-10 years, depending on your results and the type of screening  test. People at increased risk should start screening at an earlier age. Screening tests may include: ? Guaiac-based fecal occult blood testing. ? Fecal immunochemical test (FIT). ? Stool DNA test. ? Virtual colonoscopy. ? Sigmoidoscopy. During this test, a flexible tube with a tiny camera (sigmoidoscope) is used to examine your rectum and lower colon. The sigmoidoscope is inserted through your anus into your rectum and lower colon. ? Colonoscopy. During this test, a long, thin, flexible tube with a tiny camera (colonoscope) is used to examine your entire colon and rectum.  Prostate cancer screening. Recommendations will vary depending on your family history and other risks.  Hepatitis C blood test.  Hepatitis B blood test.  Sexually transmitted disease (STD) testing.  Diabetes screening. This is done by checking your blood sugar (glucose) after you have not eaten for a while (fasting). You may have this done every 1-3 years.  Abdominal aortic aneurysm (AAA) screening. You may need this if you are a current or former smoker.  Osteoporosis. You may be screened starting at age 30 if you are at high risk. Talk with your health care provider about your test results, treatment options, and if necessary, the need for more tests. Vaccines Your health care provider may recommend certain vaccines, such as:  Influenza vaccine. This is recommended every year.  Tetanus, diphtheria, and acellular pertussis (Tdap, Td) vaccine. You may need a Td booster every 10 years.  Varicella vaccine. You may need this if you have not been vaccinated.  Zoster vaccine. You may need this after age 42.  Measles, mumps, and rubella (MMR) vaccine.  You may need at least one dose of MMR if you were born in 1957 or later. You may also need a second dose.  Pneumococcal 13-valent conjugate (PCV13) vaccine. One dose is recommended after age 65.  Pneumococcal polysaccharide (PPSV23) vaccine. One dose is recommended  after age 65.  Meningococcal vaccine. You may need this if you have certain conditions.  Hepatitis A vaccine. You may need this if you have certain conditions or if you travel or work in places where you may be exposed to hepatitis A.  Hepatitis B vaccine. You may need this if you have certain conditions or if you travel or work in places where you may be exposed to hepatitis B.  Haemophilus influenzae type b (Hib) vaccine. You may need this if you have certain risk factors. Talk to your health care provider about which screenings and vaccines you need and how often you need them. This information is not intended to replace advice given to you by your health care provider. Make sure you discuss any questions you have with your health care provider. Document Released: 03/01/2015 Document Revised: 03/25/2017 Document Reviewed: 12/04/2014 Elsevier Interactive Patient Education  2019 Elsevier Inc.  

## 2018-07-06 ENCOUNTER — Telehealth: Payer: Self-pay | Admitting: Family Medicine

## 2018-07-06 NOTE — Telephone Encounter (Signed)
For review

## 2018-07-06 NOTE — Telephone Encounter (Signed)
Pt appt changed / aware

## 2018-07-18 ENCOUNTER — Ambulatory Visit: Payer: PPO | Admitting: Family Medicine

## 2018-07-27 DIAGNOSIS — H401113 Primary open-angle glaucoma, right eye, severe stage: Secondary | ICD-10-CM | POA: Diagnosis not present

## 2018-07-27 DIAGNOSIS — Z9889 Other specified postprocedural states: Secondary | ICD-10-CM | POA: Diagnosis not present

## 2018-09-12 ENCOUNTER — Other Ambulatory Visit: Payer: PPO

## 2018-09-12 ENCOUNTER — Other Ambulatory Visit: Payer: Self-pay

## 2018-09-12 DIAGNOSIS — Z8639 Personal history of other endocrine, nutritional and metabolic disease: Secondary | ICD-10-CM

## 2018-09-12 DIAGNOSIS — N400 Enlarged prostate without lower urinary tract symptoms: Secondary | ICD-10-CM

## 2018-09-12 DIAGNOSIS — E7849 Other hyperlipidemia: Secondary | ICD-10-CM | POA: Diagnosis not present

## 2018-09-12 DIAGNOSIS — E559 Vitamin D deficiency, unspecified: Secondary | ICD-10-CM

## 2018-09-12 LAB — URINALYSIS, COMPLETE
Bilirubin, UA: NEGATIVE
Glucose, UA: NEGATIVE
Ketones, UA: NEGATIVE
Leukocytes,UA: NEGATIVE
Nitrite, UA: NEGATIVE
Protein,UA: NEGATIVE
RBC, UA: NEGATIVE
Specific Gravity, UA: 1.01 (ref 1.005–1.030)
Urobilinogen, Ur: 0.2 mg/dL (ref 0.2–1.0)
pH, UA: 5.5 (ref 5.0–7.5)

## 2018-09-12 LAB — MICROSCOPIC EXAMINATION
Bacteria, UA: NONE SEEN
Epithelial Cells (non renal): NONE SEEN /hpf (ref 0–10)
RBC, Urine: NONE SEEN /hpf (ref 0–2)
Renal Epithel, UA: NONE SEEN /hpf
WBC, UA: NONE SEEN /hpf (ref 0–5)

## 2018-09-13 ENCOUNTER — Other Ambulatory Visit: Payer: Self-pay

## 2018-09-13 LAB — CBC WITH DIFFERENTIAL/PLATELET
Basophils Absolute: 0.1 x10E3/uL (ref 0.0–0.2)
Basos: 1 %
EOS (ABSOLUTE): 0.2 x10E3/uL (ref 0.0–0.4)
Eos: 3 %
Hematocrit: 46.4 % (ref 37.5–51.0)
Hemoglobin: 15.6 g/dL (ref 13.0–17.7)
Immature Grans (Abs): 0 x10E3/uL (ref 0.0–0.1)
Immature Granulocytes: 0 %
Lymphocytes Absolute: 2.1 x10E3/uL (ref 0.7–3.1)
Lymphs: 32 %
MCH: 33.1 pg — ABNORMAL HIGH (ref 26.6–33.0)
MCHC: 33.6 g/dL (ref 31.5–35.7)
MCV: 98 fL — ABNORMAL HIGH (ref 79–97)
Monocytes Absolute: 0.4 x10E3/uL (ref 0.1–0.9)
Monocytes: 6 %
Neutrophils Absolute: 3.8 x10E3/uL (ref 1.4–7.0)
Neutrophils: 58 %
Platelets: 158 x10E3/uL (ref 150–450)
RBC: 4.72 x10E6/uL (ref 4.14–5.80)
RDW: 12.5 % (ref 11.6–15.4)
WBC: 6.5 x10E3/uL (ref 3.4–10.8)

## 2018-09-13 LAB — LIPID PANEL
Chol/HDL Ratio: 3.9 ratio (ref 0.0–5.0)
Cholesterol, Total: 138 mg/dL (ref 100–199)
HDL: 35 mg/dL — ABNORMAL LOW
LDL Calculated: 80 mg/dL (ref 0–99)
Triglycerides: 116 mg/dL (ref 0–149)
VLDL Cholesterol Cal: 23 mg/dL (ref 5–40)

## 2018-09-13 LAB — CMP14+EGFR
ALT: 27 IU/L (ref 0–44)
AST: 23 IU/L (ref 0–40)
Albumin/Globulin Ratio: 2.4 — ABNORMAL HIGH (ref 1.2–2.2)
Albumin: 4.8 g/dL (ref 3.8–4.8)
Alkaline Phosphatase: 65 IU/L (ref 39–117)
BUN/Creatinine Ratio: 11 (ref 10–24)
BUN: 12 mg/dL (ref 8–27)
Bilirubin Total: 0.3 mg/dL (ref 0.0–1.2)
CO2: 26 mmol/L (ref 20–29)
Calcium: 9.3 mg/dL (ref 8.6–10.2)
Chloride: 103 mmol/L (ref 96–106)
Creatinine, Ser: 1.06 mg/dL (ref 0.76–1.27)
GFR calc Af Amer: 84 mL/min/{1.73_m2} (ref >59)
GFR calc non Af Amer: 72 mL/min/{1.73_m2} (ref >59)
Globulin, Total: 2 g/dL (ref 1.5–4.5)
Glucose: 98 mg/dL (ref 65–99)
Potassium: 4.6 mmol/L (ref 3.5–5.2)
Sodium: 143 mmol/L (ref 134–144)
Total Protein: 6.8 g/dL (ref 6.0–8.5)

## 2018-09-13 LAB — PSA, TOTAL AND FREE
PSA, Free Pct: 30 %
PSA, Free: 0.18 ng/mL
Prostate Specific Ag, Serum: 0.6 ng/mL (ref 0.0–4.0)

## 2018-09-13 LAB — VITAMIN D 25 HYDROXY (VIT D DEFICIENCY, FRACTURES): Vit D, 25-Hydroxy: 77.8 ng/mL (ref 30.0–100.0)

## 2018-09-13 LAB — TSH: TSH: 4.87 u[IU]/mL — ABNORMAL HIGH (ref 0.450–4.500)

## 2018-09-14 ENCOUNTER — Encounter: Payer: Self-pay | Admitting: Family Medicine

## 2018-09-14 ENCOUNTER — Ambulatory Visit: Payer: PPO | Admitting: Family Medicine

## 2018-09-14 ENCOUNTER — Ambulatory Visit (INDEPENDENT_AMBULATORY_CARE_PROVIDER_SITE_OTHER): Payer: PPO | Admitting: Family Medicine

## 2018-09-14 VITALS — BP 128/72 | HR 68 | Temp 99.8°F | Ht 71.0 in | Wt 197.4 lb

## 2018-09-14 DIAGNOSIS — Z0000 Encounter for general adult medical examination without abnormal findings: Secondary | ICD-10-CM

## 2018-09-14 DIAGNOSIS — E7849 Other hyperlipidemia: Secondary | ICD-10-CM | POA: Diagnosis not present

## 2018-09-14 DIAGNOSIS — N400 Enlarged prostate without lower urinary tract symptoms: Secondary | ICD-10-CM | POA: Diagnosis not present

## 2018-09-14 DIAGNOSIS — Z8639 Personal history of other endocrine, nutritional and metabolic disease: Secondary | ICD-10-CM

## 2018-09-14 DIAGNOSIS — Z0001 Encounter for general adult medical examination with abnormal findings: Secondary | ICD-10-CM

## 2018-09-14 DIAGNOSIS — E559 Vitamin D deficiency, unspecified: Secondary | ICD-10-CM

## 2018-09-14 DIAGNOSIS — Z23 Encounter for immunization: Secondary | ICD-10-CM

## 2018-09-14 MED ORDER — VITAMIN D (ERGOCALCIFEROL) 1.25 MG (50000 UNIT) PO CAPS: 50000.0000 [IU] | ORAL_CAPSULE | ORAL | 3 refills | Status: DC

## 2018-09-14 MED ORDER — ROSUVASTATIN CALCIUM 40 MG PO TABS: 40.0000 mg | ORAL_TABLET | Freq: Every day | ORAL | 3 refills | Status: DC

## 2018-09-14 MED ORDER — OMEGA-3-ACID ETHYL ESTERS 1 G PO CAPS: ORAL_CAPSULE | ORAL | 3 refills | Status: DC

## 2018-09-14 NOTE — Addendum Note (Signed)
Addended by: Karle Plumber on: 09/14/2018 03:49 PM   Modules accepted: Orders

## 2018-09-14 NOTE — Progress Notes (Signed)
BP 128/72   Pulse 68   Temp 99.8 F (37.7 C) (Temporal)   Ht '5\' 11"'$  (1.803 m)   Wt 197 lb 6.4 oz (89.5 kg)   BMI 27.53 kg/m    Subjective:   Patient ID: Austin Clark, male    DOB: 1950-12-17, 68 y.o.   MRN: [id number]  HPI: Austin Clark is a 68 y.o. male presenting on 09/14/2018 for Establish Care ([facility]) and Annual Exam   HPI Annual exam Patient denies any chest pain, shortness of breath, headaches or vision issues, abdominal complaints, diarrhea, nausea, vomiting, or joint issues.   Hyperlipidemia Patient is coming in for recheck of his hyperlipidemia. The patient is currently taking omega-3's and rosuvastatin. They deny any issues with myalgias or history of liver damage from it. They deny any focal numbness or weakness or chest pain.   Hypothyroidism recheck Patient is coming in for thyroid recheck today as well. They deny any issues with hair changes or heat or cold problems or diarrhea or constipation. They deny any chest pain or palpitations. They are currently on no medications currently but had minutes on some in the past, his TSHs have been ranging between 2 and 4 over the past 4 years and this time it was a TSH of 4.8.  BPH Patient currently has been diagnosed with BPH and they have been monitoring, his PSA looks good on his last lab draw.  He denies any major issues with urination or stream issues.  Patient vitamin D deficiency recheck.  His vitamin D on his last check looked a lot better and had continue on current dose of vitamin D.  Relevant past medical, surgical, family and social history reviewed and updated as indicated. Interim medical history since our last visit reviewed. Allergies and medications reviewed and updated.  Review of Systems  Constitutional: Negative for chills and fever.  HENT: Negative for ear pain and tinnitus.   Eyes: Negative for pain and discharge.  Respiratory: Negative for cough, shortness of breath and wheezing.   Cardiovascular:  Negative for chest pain, palpitations and leg swelling.  Gastrointestinal: Negative for abdominal pain, blood in stool, constipation and diarrhea.  Genitourinary: Negative for dysuria and hematuria.  Musculoskeletal: Negative for back pain, gait problem and myalgias.  Skin: Negative for rash.  Neurological: Negative for dizziness, weakness and headaches.  Psychiatric/Behavioral: Negative for suicidal ideas.  All other systems reviewed and are negative.   Per HPI unless specifically indicated above   Allergies as of 09/14/2018      Reactions   Penicillins Rash      Medication List       Accurate as of September 14, 2018  3:23 PM. If you have any questions, ask your nurse or doctor.        dorzolamide-timolol 22.3-6.8 MG/ML ophthalmic solution Commonly known as: COSOPT Place 1 drop into the left eye.   omega-3 acid ethyl esters 1 g capsule Commonly known as: LOVAZA TAKE 2 CAPSULE BY MOUTH TWICE A DAY AS DIRECTED   rosuvastatin 40 MG tablet Commonly known as: CRESTOR Take 1 tablet (40 mg total) by mouth daily.   Vitamin D (Ergocalciferol) 1.25 MG (50000 UT) Caps capsule Commonly known as: DRISDOL Take 1 capsule (50,000 Units total) by mouth every 7 (seven) days.   Vyzulta 0.024 % Soln Generic drug: Latanoprostene Bunod Apply to eye. What changed: Another medication with the same name was removed. Continue taking this medication, and follow the directions you see here. Changed by: Fransisca Kaufmann  Suzzane Quilter, MD        Objective:   BP 128/72   Pulse 68   Temp 99.8 F (37.7 C) (Temporal)   Ht '5\' 11"'$  (1.803 m)   Wt 197 lb 6.4 oz (89.5 kg)   BMI 27.53 kg/m   Wt Readings from Last 3 Encounters:  09/14/18 197 lb 6.4 oz (89.5 kg)  01/12/18 197 lb (89.4 kg)  10/23/17 200 lb 6.4 oz (90.9 kg)    Physical Exam Vitals signs and nursing note reviewed.  Constitutional:      General: He is not in acute distress.    Appearance: He is well-developed. He is not diaphoretic.  Eyes:      General: No scleral icterus.    Conjunctiva/sclera: Conjunctivae normal.  Neck:     Musculoskeletal: Neck supple.     Thyroid: No thyromegaly.  Cardiovascular:     Rate and Rhythm: Normal rate and regular rhythm.     Heart sounds: Normal heart sounds. No murmur.  Pulmonary:     Effort: Pulmonary effort is normal. No respiratory distress.     Breath sounds: Normal breath sounds. No wheezing.  Genitourinary:    Prostate: Normal.     Rectum: Normal.  Musculoskeletal: Normal range of motion.  Lymphadenopathy:     Cervical: No cervical adenopathy.  Skin:    General: Skin is warm and dry.     Findings: No rash.  Neurological:     Mental Status: He is alert and oriented to person, place, and time.     Coordination: Coordination normal.  Psychiatric:        Behavior: Behavior normal.     Results for orders placed or performed in visit on 09/12/18  Microscopic Examination   URINE  Result Value Ref Range   WBC, UA None seen 0 - 5 /hpf   RBC None seen 0 - 2 /hpf   Epithelial Cells (non renal) None seen 0 - 10 /hpf   Renal Epithel, UA None seen None seen /hpf   Bacteria, UA None seen None seen/Few  CBC with Differential/Platelet  Result Value Ref Range   WBC 6.5 3.4 - 10.8 x10E3/uL   RBC 4.72 4.14 - 5.80 x10E6/uL   Hemoglobin 15.6 13.0 - 17.7 g/dL   Hematocrit 46.4 37.5 - 51.0 %   MCV 98 (H) 79 - 97 fL   MCH 33.1 (H) 26.6 - 33.0 pg   MCHC 33.6 31.5 - 35.7 g/dL   RDW 12.5 11.6 - 15.4 %   Platelets 158 150 - 450 x10E3/uL   Neutrophils 58 Not Estab. %   Lymphs 32 Not Estab. %   Monocytes 6 Not Estab. %   Eos 3 Not Estab. %   Basos 1 Not Estab. %   Neutrophils Absolute 3.8 1.4 - 7.0 x10E3/uL   Lymphocytes Absolute 2.1 0.7 - 3.1 x10E3/uL   Monocytes Absolute 0.4 0.1 - 0.9 x10E3/uL   EOS (ABSOLUTE) 0.2 0.0 - 0.4 x10E3/uL   Basophils Absolute 0.1 0.0 - 0.2 x10E3/uL   Immature Granulocytes 0 Not Estab. %   Immature Grans (Abs) 0.0 0.0 - 0.1 x10E3/uL  CMP14+EGFR  Result  Value Ref Range   Glucose 98 65 - 99 mg/dL   BUN 12 8 - 27 mg/dL   Creatinine, Ser 1.06 0.76 - 1.27 mg/dL   GFR calc non Af Amer 72 >59 mL/min/1.73   GFR calc Af Amer 84 >59 mL/min/1.73   BUN/Creatinine Ratio 11 10 - 24   Sodium 143 134 -  144 mmol/L   Potassium 4.6 3.5 - 5.2 mmol/L   Chloride 103 96 - 106 mmol/L   CO2 26 20 - 29 mmol/L   Calcium 9.3 8.6 - 10.2 mg/dL   Total Protein 6.8 6.0 - 8.5 g/dL   Albumin 4.8 3.8 - 4.8 g/dL   Globulin, Total 2.0 1.5 - 4.5 g/dL   Albumin/Globulin Ratio 2.4 (H) 1.2 - 2.2   Bilirubin Total 0.3 0.0 - 1.2 mg/dL   Alkaline Phosphatase 65 39 - 117 IU/L   AST 23 0 - 40 IU/L   ALT 27 0 - 44 IU/L  Lipid panel  Result Value Ref Range   Cholesterol, Total 138 100 - 199 mg/dL   Triglycerides 116 0 - 149 mg/dL   HDL 35 (L) >39 mg/dL   VLDL Cholesterol Cal 23 5 - 40 mg/dL   LDL Calculated 80 0 - 99 mg/dL   Chol/HDL Ratio 3.9 0.0 - 5.0 ratio  TSH  Result Value Ref Range   TSH 4.870 (H) 0.450 - 4.500 uIU/mL  PSA, total and free  Result Value Ref Range   Prostate Specific Ag, Serum 0.6 0.0 - 4.0 ng/mL   PSA, Free 0.18 N/A ng/mL   PSA, Free Pct 30.0 %  Urinalysis, Complete  Result Value Ref Range   Specific Gravity, UA 1.010 1.005 - 1.030   pH, UA 5.5 5.0 - 7.5   Color, UA Yellow Yellow   Appearance Ur Clear Clear   Leukocytes,UA Negative Negative   Protein,UA Negative Negative/Trace   Glucose, UA Negative Negative   Ketones, UA Negative Negative   RBC, UA Negative Negative   Bilirubin, UA Negative Negative   Urobilinogen, Ur 0.2 0.2 - 1.0 mg/dL   Nitrite, UA Negative Negative   Microscopic Examination See below:   VITAMIN D 25 Hydroxy (Vit-D Deficiency, Fractures)  Result Value Ref Range   Vit D, 25-Hydroxy 77.8 30.0 - 100.0 ng/mL    Assessment & Plan:   Problem List Items Addressed This Visit      Genitourinary   BPH (benign prostatic hyperplasia) (Chronic)     Other   Hyperlipidemia (Chronic)   Relevant Medications   omega-3  acid ethyl esters (LOVAZA) 1 g capsule   rosuvastatin (CRESTOR) 40 MG tablet   History of hypothyroidism (Chronic)   Vitamin D deficiency   Relevant Medications   Vitamin D, Ergocalciferol, (DRISDOL) 1.25 MG (50000 UT) CAPS capsule    Other Visit Diagnoses    Physical exam    -  Primary       Follow up plan: Return in about 1 year (around 09/14/2019), or if symptoms worsen or fail to improve, for Cholesterol and BPH recheck.  Counseling provided for all of the vaccine components No orders of the defined types were placed in this encounter.   Caryl Pina, MD [facility] 09/14/2018, 3:23 PM

## 2018-09-15 IMAGING — DX DG CHEST 2V
2 series · 2 of 2 positions shown · non-contrast
Comparison: 11/09/2013

CLINICAL DATA: hyperlipidemia

EXAM:
CHEST  2 VIEW

[chest pa]
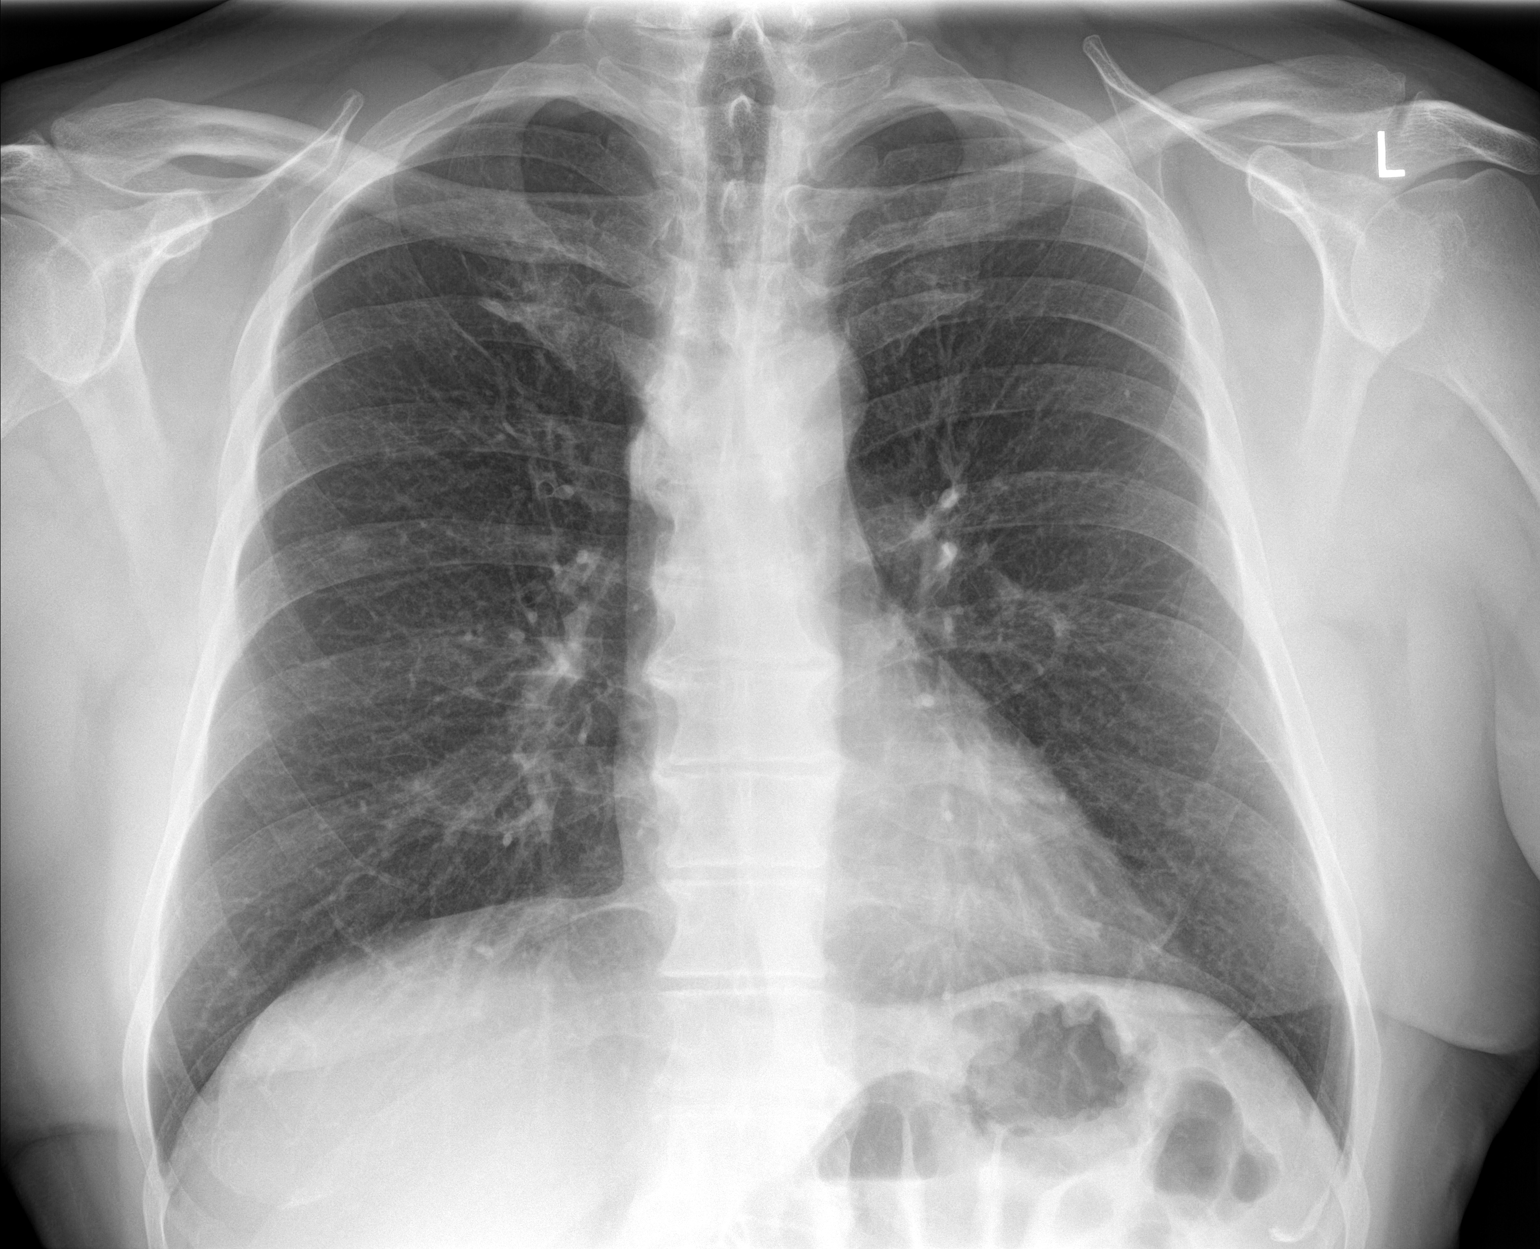

[chest lat]
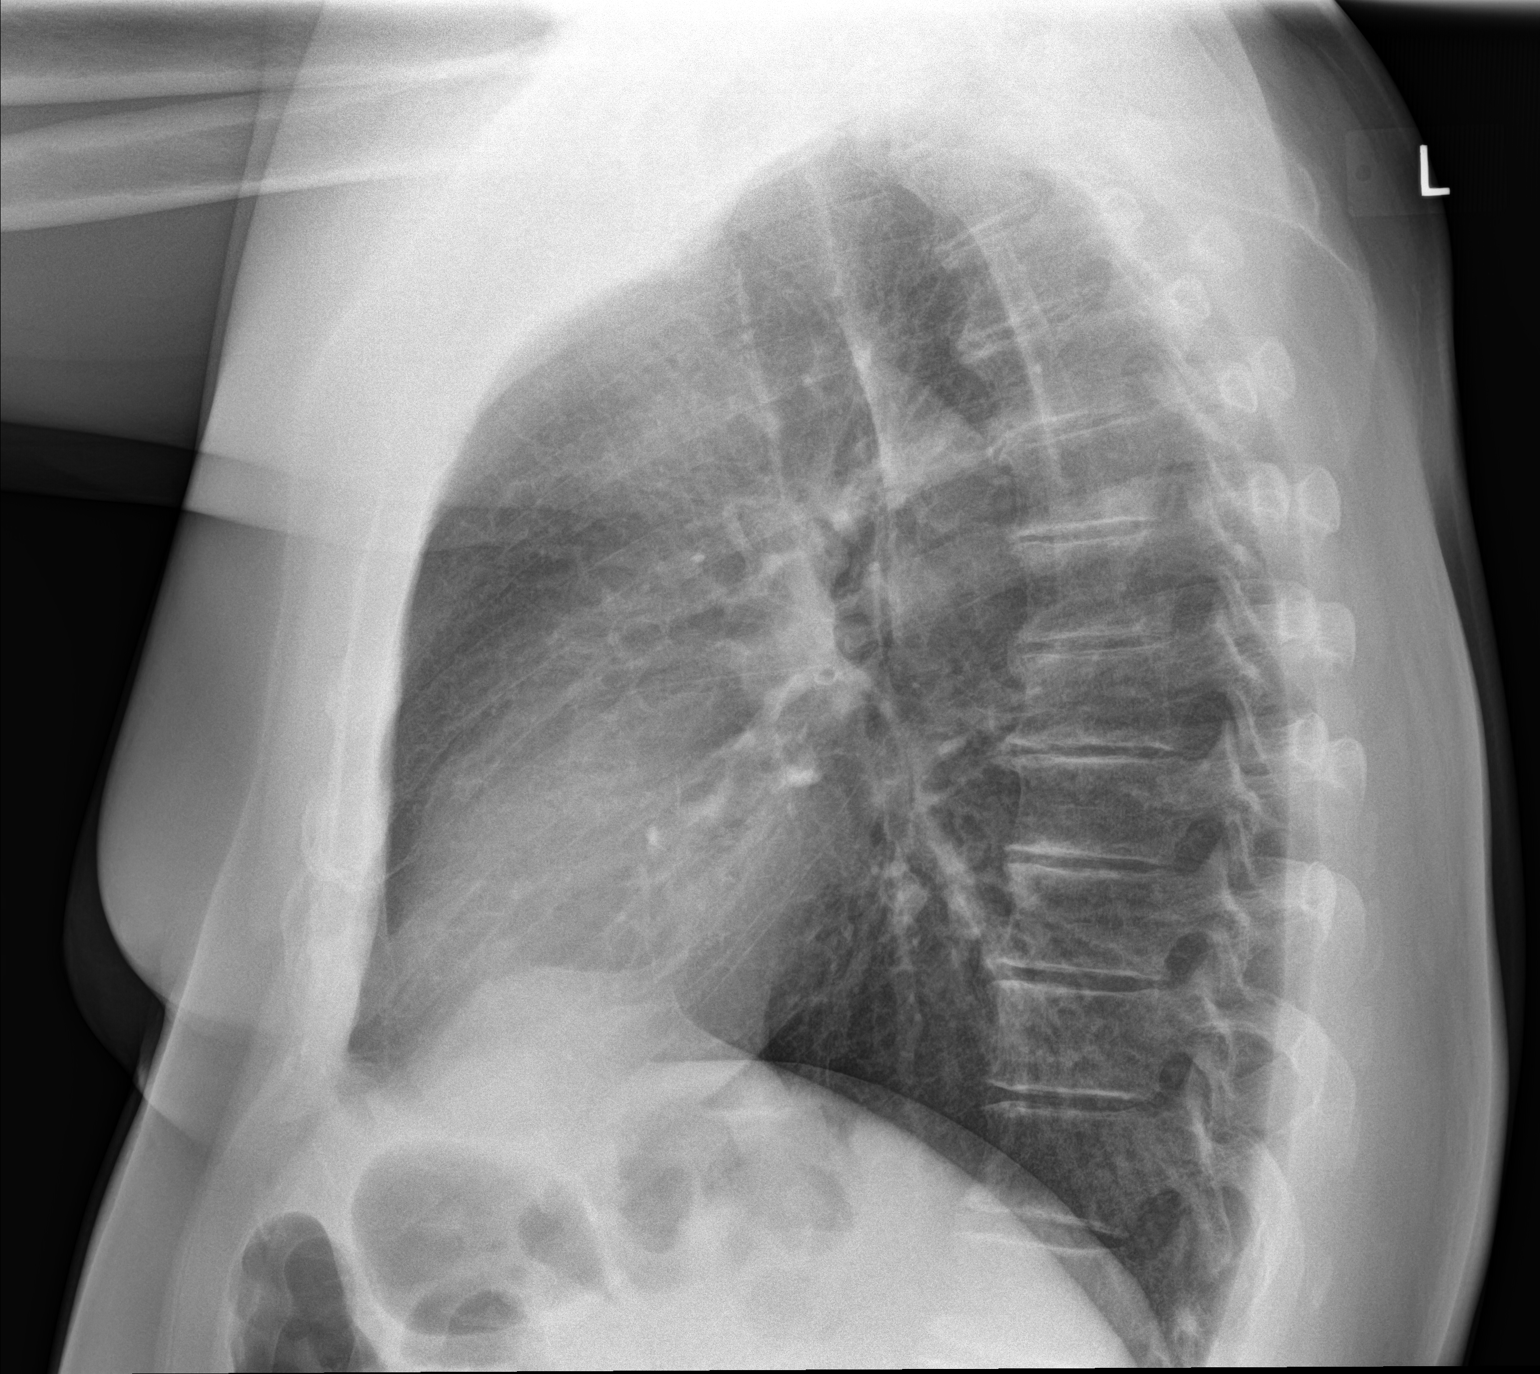

[2 of 2 positions shown; findings below may reference images not displayed]

FINDINGS: Cardiomediastinal silhouette is stable. No acute infiltrate or
pleural effusion. No pulmonary edema. Degenerative changes thoracic
spine. Stable 7 mm nodule in right upper lobe.
IMPRESSION: No active cardiopulmonary disease.

## 2018-10-18 ENCOUNTER — Other Ambulatory Visit: Payer: Self-pay

## 2018-10-18 ENCOUNTER — Ambulatory Visit (INDEPENDENT_AMBULATORY_CARE_PROVIDER_SITE_OTHER): Payer: PPO | Admitting: Family Medicine

## 2018-10-18 ENCOUNTER — Encounter: Payer: Self-pay | Admitting: Family Medicine

## 2018-10-18 VITALS — BP 123/75 | HR 63 | Temp 97.1°F | Ht 71.0 in | Wt 200.0 lb

## 2018-10-18 DIAGNOSIS — Z23 Encounter for immunization: Secondary | ICD-10-CM

## 2018-10-18 DIAGNOSIS — S40262A Insect bite (nonvenomous) of left shoulder, initial encounter: Secondary | ICD-10-CM | POA: Diagnosis not present

## 2018-10-18 DIAGNOSIS — R21 Rash and other nonspecific skin eruption: Secondary | ICD-10-CM | POA: Diagnosis not present

## 2018-10-18 DIAGNOSIS — A770 Spotted fever due to Rickettsia rickettsii: Secondary | ICD-10-CM | POA: Diagnosis not present

## 2018-10-18 DIAGNOSIS — W57XXXA Bitten or stung by nonvenomous insect and other nonvenomous arthropods, initial encounter: Secondary | ICD-10-CM

## 2018-10-18 DIAGNOSIS — H409 Unspecified glaucoma: Secondary | ICD-10-CM | POA: Insufficient documentation

## 2018-10-18 NOTE — Patient Instructions (Signed)

## 2018-10-18 NOTE — Progress Notes (Signed)
Subjective:  Patient ID: Austin Clark, male    DOB: [birth year], 68 y.o.   MRN: [id number]  Patient Care Team: Dettinger, Fransisca Kaufmann, MD as PCP - General (Family Medicine) Clent Jacks, MD (Ophthalmology) Irine Seal, MD (Urology)   Chief Complaint:  Tick Removal   HPI: Austin Clark is a 68 y.o. male presenting on 10/18/2018 for Tick Removal   Pt presents today for evaluation after a tick bite. Pt states he removed a tick from his left upper arm 3-4 days ago. Pt states he still has a red raised area where he removed the tick. States his wife wanted him to have it checked out. He denies fever, chills, arthralgias, myalgias, pruritis, spreading rash, abdominal pain, headache, fever, or chills.   Rash This is a new problem. The current episode started in the past 7 days. The problem has been gradually improving since onset. The affected locations include the left arm. The rash is characterized by redness and itchiness. Associated with: tick bite. Pertinent negatives include no anorexia, congestion, cough, diarrhea, eye pain, facial edema, fatigue, fever, joint pain, nail changes, rhinorrhea, shortness of breath, sore throat or vomiting. Past treatments include anti-itch cream. The treatment provided moderate relief.     Relevant past medical, surgical, family, and social history reviewed and updated as indicated.  Allergies and medications reviewed and updated. Date reviewed: Chart in Epic.   Past Medical History:  Diagnosis Date  . BPH (benign prostatic hypertrophy)   . Colon polyps   . Cough   . Glaucoma 1993  . Hyperlipidemia   . Hypothyroid   . Knee pain   . Pulmonary nodule   . Tobacco use     Past Surgical History:  Procedure Laterality Date  . EYE SURGERY Bilateral    cataract  . knee surgery Right    "cleaned"  . LASER SURGERY     GLAUCOMA   . TONSILLECTOMY      Social History   Socioeconomic History  . Marital status: Married    Spouse name: [name]  . Number  of children: 2  . Years of education: Not on file  . Highest education level: High school graduate  Occupational History  . Occupation: fulltime    Employer: [employer]  . Occupation: Retired  Scientific laboratory technician  . Financial resource strain: Not hard at all  . Food insecurity    Worry: Never true    Inability: Never true  . Transportation needs    Medical: No    Non-medical: No  Tobacco Use  . Smoking status: Former Smoker    Packs/day: 1.00    Types: Cigarettes    Start date: 02/17/1967    Quit date: 02/16/1997    Years since quitting: 21.6  . Smokeless tobacco: Never Used  . Tobacco comment: Rare cigarettes  Substance and Sexual Activity  . Alcohol use: Not Currently    Comment: previous  . Drug use: No  . Sexual activity: Yes    Birth control/protection: None  Lifestyle  . Physical activity    Days per week: 2 days    Minutes per session: 40 min  . Stress: Not at all  Relationships  . Social Herbalist on phone: Twice a week    Gets together: Twice a week    Attends religious service: 1 to 4 times per year    Active member of club or organization: No    Attends meetings of clubs or organizations:  Never    Relationship status: Married  . Intimate partner violence    Fear of current or ex partner: No    Emotionally abused: No    Physically abused: No    Forced sexual activity: No  Other Topics Concern  . Not on file  Social History Narrative  . Not on file    Outpatient Encounter Medications as of 10/18/2018  Medication Sig  . dorzolamide-timolol (COSOPT) 22.3-6.8 MG/ML ophthalmic solution Place 1 drop into the left eye.   . Latanoprostene Bunod (VYZULTA) 0.024 % SOLN Apply to eye.  . omega-3 acid ethyl esters (LOVAZA) 1 g capsule TAKE 2 CAPSULE BY MOUTH TWICE A DAY AS DIRECTED  . rosuvastatin (CRESTOR) 40 MG tablet Take 1 tablet (40 mg total) by mouth daily.  . Vitamin D, Ergocalciferol, (DRISDOL) 1.25 MG (50000 UT) CAPS capsule Take 1  capsule (50,000 Units total) by mouth every 7 (seven) days.   No facility-administered encounter medications on file as of 10/18/2018.     Allergies  Allergen Reactions  . Penicillins Rash    Review of Systems  Constitutional: Negative for activity change, appetite change, chills, diaphoresis, fatigue, fever and unexpected weight change.  HENT: Negative for congestion, rhinorrhea and sore throat.   Eyes: Negative for pain.  Respiratory: Negative for cough, choking, chest tightness, shortness of breath and wheezing.   Cardiovascular: Negative for chest pain, palpitations and leg swelling.  Gastrointestinal: Negative for abdominal pain, anorexia, constipation, diarrhea, nausea and vomiting.  Genitourinary: Negative for decreased urine volume and difficulty urinating.  Musculoskeletal: Negative for arthralgias, joint pain, myalgias, neck pain and neck stiffness.  Skin: Positive for rash. Negative for color change, nail changes and pallor.  Neurological: Negative for tremors, seizures, syncope, facial asymmetry, speech difficulty, weakness, light-headedness, numbness and headaches.  Psychiatric/Behavioral: Negative for confusion.  All other systems reviewed and are negative.       Objective:  BP 123/75   Pulse 63   Temp (!) 97.1 F (36.2 C)   Ht 5\' 11"  (1.803 m)   Wt 200 lb (90.7 kg)   BMI 27.89 kg/m    Wt Readings from Last 3 Encounters:  10/18/18 200 lb (90.7 kg)  09/14/18 197 lb 6.4 oz (89.5 kg)  01/12/18 197 lb (89.4 kg)    Physical Exam Vitals signs and nursing note reviewed.  Constitutional:      General: He is not in acute distress.    Appearance: Normal appearance. He is well-developed and well-groomed. He is not ill-appearing, toxic-appearing or diaphoretic.  HENT:     Head: Normocephalic and atraumatic.     Jaw: There is normal jaw occlusion.     Right Ear: Hearing normal.     Left Ear: Hearing normal.     Nose: Nose normal.     Mouth/Throat:     Lips: Pink.      Mouth: Mucous membranes are moist.     Pharynx: Oropharynx is clear. Uvula midline.  Eyes:     General: Lids are normal.     Extraocular Movements: Extraocular movements intact.     Conjunctiva/sclera: Conjunctivae normal.     Pupils: Pupils are equal, round, and reactive to light.  Neck:     Musculoskeletal: Normal range of motion and neck supple.     Thyroid: No thyroid mass, thyromegaly or thyroid tenderness.     Vascular: No carotid bruit or JVD.     Trachea: Trachea and phonation normal.  Cardiovascular:     Rate and Rhythm:  Normal rate and regular rhythm.     Chest Wall: PMI is not displaced.     Pulses: Normal pulses.     Heart sounds: Normal heart sounds. No murmur. No friction rub. No gallop.   Pulmonary:     Effort: Pulmonary effort is normal. No respiratory distress.     Breath sounds: Normal breath sounds. No wheezing.  Abdominal:     General: Bowel sounds are normal. There is no distension or abdominal bruit.     Palpations: Abdomen is soft. There is no hepatomegaly or splenomegaly.     Tenderness: There is no abdominal tenderness. There is no right CVA tenderness or left CVA tenderness.     Hernia: No hernia is present.  Musculoskeletal: Normal range of motion.     Right lower leg: No edema.     Left lower leg: No edema.  Lymphadenopathy:     Cervical: No cervical adenopathy.  Skin:    General: Skin is warm and dry.     Capillary Refill: Capillary refill takes less than 2 seconds.     Coloration: Skin is not cyanotic, jaundiced or pale.     Findings: Rash present.       Neurological:     General: No focal deficit present.     Mental Status: He is alert and oriented to person, place, and time.     Cranial Nerves: Cranial nerves are intact.     Sensory: Sensation is intact.     Motor: Motor function is intact.     Coordination: Coordination is intact.     Gait: Gait is intact.     Deep Tendon Reflexes: Reflexes are normal and symmetric.  Psychiatric:         Attention and Perception: Attention and perception normal.        Mood and Affect: Mood and affect normal.        Speech: Speech normal.        Behavior: Behavior normal. Behavior is cooperative.        Thought Content: Thought content normal.        Cognition and Memory: Cognition and memory normal.        Judgment: Judgment normal.     Results for orders placed or performed in visit on 10/18/18  Rocky mtn spotted fvr abs pnl(IgG+IgM)  Result Value Ref Range   RMSF IgG WILL FOLLOW    RMSF IgM WILL FOLLOW   Lyme Ab/Western Blot Reflex  Result Value Ref Range   Lyme IgG/IgM Ab <0.91 0.00 - 0.90 ISR   LYME DISEASE AB, QUANT, IGM <0.80 0.00 - 0.79 index  Alpha-Gal Panel  Result Value Ref Range   Beef (Bos spp) IgE WILL FOLLOW    Class Interpretation WILL FOLLOW    Lamb/Mutton (Ovis spp) IgE WILL FOLLOW    Class Interpretation WILL FOLLOW    Pork (Sus spp) IgE WILL FOLLOW    Class Interpretation WILL FOLLOW    Alpha Gal IgE* WILL FOLLOW        Pertinent labs & imaging results that were available during my care of the patient were reviewed by me and considered in my medical decision making.  Assessment & Plan:  Tylique was seen today for tick removal.  Diagnoses and all orders for this visit:  Tick bite of deltoid region, left, initial encounter Rash Recent tick removal. Will check for tick born illnesses and Alpha-Gal. No concerning symptoms for tick born illnesses so will not initiate empiric antibiotics  today. Pt aware if any symptoms develop, to call office for treatment. Labs pending.  -     Rocky mtn spotted fvr abs pnl(IgG+IgM) -     Lyme Ab/Western Blot Reflex -     Alpha-Gal Panel  Need for immunization against influenza -     Flu Vaccine QUAD High Dose(Fluad)     Continue all other maintenance medications.  Follow up plan: Return if symptoms worsen or fail to improve.  Continue healthy lifestyle choices, including diet (rich in fruits, vegetables, and  lean proteins, and low in salt and simple carbohydrates) and exercise (at least 30 minutes of moderate physical activity daily).  Educational handout given for Tick Bite  The above assessment and management plan was discussed with the patient. The patient verbalized understanding of and has agreed to the management plan. Patient is aware to call the clinic if symptoms persist or worsen. Patient is aware when to return to the clinic for a follow-up visit. Patient educated on when it is appropriate to go to the emergency department.   Monia Pouch, FNP-C [facility] [phone]

## 2018-10-19 ENCOUNTER — Ambulatory Visit: Payer: PPO

## 2018-10-20 DIAGNOSIS — A770 Spotted fever due to Rickettsia rickettsii: Secondary | ICD-10-CM | POA: Insufficient documentation

## 2018-10-20 MED ORDER — DOXYCYCLINE HYCLATE 100 MG PO TABS: 100.0000 mg | ORAL_TABLET | Freq: Two times a day (BID) | ORAL | 0 refills | Status: AC

## 2018-10-20 NOTE — Addendum Note (Signed)
Addended by: Baruch Gouty on: 10/20/2018 08:45 AM   Modules accepted: Orders

## 2018-10-21 LAB — ALPHA-GAL PANEL
Alpha Gal IgE*: <0.1 kU/L (ref <0.10)
Beef (Bos spp) IgE: <0.1 kU/L (ref <0.35)
Class Interpretation: 0
Class Interpretation: 0
Class Interpretation: 0
Lamb/Mutton (Ovis spp) IgE: <0.1 kU/L (ref <0.35)
Pork (Sus spp) IgE: <0.1 kU/L (ref <0.35)

## 2018-10-21 LAB — ROCKY MTN SPOTTED FVR ABS PNL(IGG+IGM)
RMSF IgG: NEGATIVE
RMSF IgM: 1.41 index — ABNORMAL HIGH (ref 0.00–0.89)

## 2018-10-21 LAB — LYME AB/WESTERN BLOT REFLEX
LYME DISEASE AB, QUANT, IGM: <0.8 index (ref 0.00–0.79)
Lyme IgG/IgM Ab: <0.91 {ISR} (ref 0.00–0.90)

## 2018-10-26 DIAGNOSIS — H401113 Primary open-angle glaucoma, right eye, severe stage: Secondary | ICD-10-CM | POA: Diagnosis not present

## 2018-10-26 DIAGNOSIS — Z9889 Other specified postprocedural states: Secondary | ICD-10-CM | POA: Diagnosis not present

## 2018-11-02 ENCOUNTER — Other Ambulatory Visit: Payer: Self-pay | Admitting: Family Medicine

## 2018-11-02 DIAGNOSIS — A770 Spotted fever due to Rickettsia rickettsii: Secondary | ICD-10-CM

## 2018-11-02 DIAGNOSIS — R21 Rash and other nonspecific skin eruption: Secondary | ICD-10-CM

## 2018-11-02 DIAGNOSIS — S40262A Insect bite (nonvenomous) of left shoulder, initial encounter: Secondary | ICD-10-CM

## 2018-11-02 DIAGNOSIS — W57XXXA Bitten or stung by nonvenomous insect and other nonvenomous arthropods, initial encounter: Secondary | ICD-10-CM

## 2019-01-16 ENCOUNTER — Other Ambulatory Visit: Payer: Self-pay

## 2019-01-16 DIAGNOSIS — Z20822 Contact with and (suspected) exposure to covid-19: Secondary | ICD-10-CM

## 2019-01-17 LAB — NOVEL CORONAVIRUS, NAA: SARS-CoV-2, NAA: NOT DETECTED

## 2019-03-07 DIAGNOSIS — H26492 Other secondary cataract, left eye: Secondary | ICD-10-CM | POA: Diagnosis not present

## 2019-03-07 DIAGNOSIS — Z961 Presence of intraocular lens: Secondary | ICD-10-CM | POA: Diagnosis not present

## 2019-03-07 DIAGNOSIS — Z9889 Other specified postprocedural states: Secondary | ICD-10-CM | POA: Diagnosis not present

## 2019-03-07 DIAGNOSIS — H401133 Primary open-angle glaucoma, bilateral, severe stage: Secondary | ICD-10-CM | POA: Diagnosis not present

## 2019-06-14 ENCOUNTER — Encounter: Payer: Self-pay | Admitting: *Deleted

## 2019-06-19 DIAGNOSIS — K136 Irritative hyperplasia of oral mucosa: Secondary | ICD-10-CM | POA: Diagnosis not present

## 2019-06-26 DIAGNOSIS — Z9889 Other specified postprocedural states: Secondary | ICD-10-CM | POA: Diagnosis not present

## 2019-06-26 DIAGNOSIS — H401133 Primary open-angle glaucoma, bilateral, severe stage: Secondary | ICD-10-CM | POA: Diagnosis not present

## 2019-06-26 DIAGNOSIS — Z961 Presence of intraocular lens: Secondary | ICD-10-CM | POA: Diagnosis not present

## 2019-06-26 DIAGNOSIS — H26492 Other secondary cataract, left eye: Secondary | ICD-10-CM | POA: Diagnosis not present

## 2019-07-06 ENCOUNTER — Ambulatory Visit (INDEPENDENT_AMBULATORY_CARE_PROVIDER_SITE_OTHER): Payer: PPO | Admitting: *Deleted

## 2019-07-06 DIAGNOSIS — Z0000 Encounter for general adult medical examination without abnormal findings: Secondary | ICD-10-CM | POA: Diagnosis not present

## 2019-07-06 NOTE — Progress Notes (Signed)
MEDICARE ANNUAL WELLNESS VISIT  07/06/2019  Telephone Visit Disclaimer This Medicare AWV was conducted by telephone due to national recommendations for restrictions regarding the COVID-19 Pandemic (e.g. social distancing).  I verified, using two identifiers, that I am speaking with Austin Clark or their authorized healthcare agent. I discussed the limitations, risks, security, and privacy concerns of performing an evaluation and management service by telephone and the potential availability of an in-person appointment in the future. The patient expressed understanding and agreed to proceed.   Subjective:  Austin Clark is a 69 y.o. male patient of Dettinger, Fransisca Kaufmann, MD who had a Medicare Annual Wellness Visit today via telephone. Adien is Retired and lives with their spouse. he has 2 children. he reports that he is socially active and does interact with friends/family regularly. he is minimally physically active and enjoys golfing and yardwork.  Patient Care Team: Dettinger, Fransisca Kaufmann, MD as PCP - General (Family Medicine) Clent Jacks, MD (Ophthalmology)  Advanced Directives 07/06/2019 07/05/2018  Does Patient Have a Medical Advance Directive? Yes No  Type of Advance Directive [place] -  Does patient want to make changes to medical advance directive? No - Patient declined -  Copy of [place] in Chart? No - copy requested -  Would patient like information on creating a medical advance directive? - No - Patient declined    Hospital Utilization Over the Past 12 Months: # of hospitalizations or ER visits: 0 # of surgeries: 1  Review of Systems    Patient reports that his overall health is unchanged compared to last year.  History obtained from chart review and the patient  Patient Reported Readings (BP, Pulse, CBG, Weight, etc) none  Pain Assessment Pain : No/denies pain     Current Medications & Allergies (verified) Allergies as of  07/06/2019      Reactions   Penicillins Rash      Medication List       Accurate as of Jul 06, 2019  9:59 AM. If you have any questions, ask your nurse or doctor.        dorzolamide-timolol 22.3-6.8 MG/ML ophthalmic solution Commonly known as: COSOPT Place 1 drop into the left eye.   omega-3 acid ethyl esters 1 g capsule Commonly known as: LOVAZA TAKE 2 CAPSULE BY MOUTH TWICE A DAY AS DIRECTED   rosuvastatin 40 MG tablet Commonly known as: CRESTOR Take 1 tablet (40 mg total) by mouth daily.   Vitamin D (Ergocalciferol) 1.25 MG (50000 UNIT) Caps capsule Commonly known as: DRISDOL Take 1 capsule (50,000 Units total) by mouth every 7 (seven) days.   Vyzulta 0.024 % Soln Generic drug: Latanoprostene Bunod Apply to eye.       History (reviewed): Past Medical History:  Diagnosis Date  . BPH (benign prostatic hypertrophy)   . Colon polyps   . Cough   . Glaucoma 1993  . Hyperlipidemia   . Hypothyroid   . Knee pain   . Pulmonary nodule   . Tobacco use    Past Surgical History:  Procedure Laterality Date  . EYE SURGERY Bilateral    cataract  . knee surgery Right    "cleaned"  . LASER SURGERY     GLAUCOMA   . TONSILLECTOMY     Family History  Problem Relation Age of Onset  . Cancer Father        colon cancer  . Heart disease Mother    Social History  Socioeconomic History  . Marital status: Married    Spouse name: [name]  . Number of children: 2  . Years of education: 25  . Highest education level: High school graduate  Occupational History  . Occupation: fulltime    Employer: [employer]  . Occupation: Retired  Tobacco Use  . Smoking status: Former Smoker    Packs/day: 1.00    Types: Cigarettes    Start date: 02/17/1967    Quit date: 02/16/1997    Years since quitting: 22.3  . Smokeless tobacco: Never Used  . Tobacco comment: Rare cigarettes  Substance and Sexual Activity  . Alcohol use: Not Currently    Comment: previous  .  Drug use: No  . Sexual activity: Yes    Birth control/protection: None  Other Topics Concern  . Not on file  Social History Narrative  . Not on file   Social Determinants of Health   Financial Resource Strain:   . Difficulty of Paying Living Expenses:   Food Insecurity:   . Worried About Charity fundraiser in the Last Year:   . Arboriculturist in the Last Year:   Transportation Needs:   . Film/video editor (Medical):   Marland Kitchen Lack of Transportation (Non-Medical):   Physical Activity:   . Days of Exercise per Week:   . Minutes of Exercise per Session:   Stress:   . Feeling of Stress :   Social Connections:   . Frequency of Communication with Friends and Family:   . Frequency of Social Gatherings with Friends and Family:   . Attends Religious Services:   . Active Member of Clubs or Organizations:   . Attends Archivist Meetings:   Marland Kitchen Marital Status:     Activities of Daily Living In your present state of health, do you have any difficulty performing the following activities: 07/06/2019  Hearing? N  Vision? N  Difficulty concentrating or making decisions? N  Walking or climbing stairs? N  Dressing or bathing? N  Doing errands, shopping? N  Preparing Food and eating ? N  Using the Toilet? N  In the past six months, have you accidently leaked urine? N  Do you have problems with loss of bowel control? N  Managing your Medications? N  Managing your Finances? N  Housekeeping or managing your Housekeeping? N  Some recent data might be hidden    Patient Education/ Literacy How often do you need to have someone help you when you read instructions, pamphlets, or other written materials from your doctor or pharmacy?: 1 - Never What is the last grade level you completed in school?: 12  Exercise Current Exercise Habits: The patient does not participate in regular exercise at present, Exercise limited by: None identified  Diet Patient reports consuming 3 meals a day  and 1 snack(s) a day Patient reports that his primary diet is: Regular Patient reports that she does have regular access to food.   Depression Screen PHQ 2/9 Scores 07/06/2019 10/18/2018 10/18/2018 09/14/2018 07/05/2018 01/12/2018 10/23/2017  PHQ - 2 Score 0 0 0 0 0 0 0     Fall Risk Fall Risk  07/06/2019 10/18/2018 10/18/2018 09/14/2018 07/05/2018  Falls in the past year? 0 0 0 0 0     Objective:  Austin Rota Pott seemed alert and oriented and he participated appropriately during our telephone visit.  Blood Pressure Weight BMI  BP Readings from Last 3 Encounters:  10/18/18 123/75  09/14/18 128/72  01/12/18 Marland Kitchen)  108/56   Wt Readings from Last 3 Encounters:  10/18/18 200 lb (90.7 kg)  09/14/18 197 lb 6.4 oz (89.5 kg)  01/12/18 197 lb (89.4 kg)   BMI Readings from Last 1 Encounters:  10/18/18 27.89 kg/m    *Unable to obtain current vital signs, weight, and BMI due to telephone visit type  Hearing/Vision  . Cristen did not seem to have difficulty with hearing/understanding during the telephone conversation . Reports that he has had a formal eye exam by an eye care professional within the past year . Reports that he has not had a formal hearing evaluation within the past year *Unable to fully assess hearing and vision during telephone visit type  Cognitive Function: 6CIT Screen 07/06/2019 07/05/2018  What Year? 0 points -  What month? 0 points 0 points  What time? 0 points 0 points  Count back from 20 0 points 2 points  Months in reverse 0 points 0 points  Repeat phrase 0 points 0 points  Total Score 0 -   (Normal:0-7, Significant for Dysfunction: >8)  Normal Cognitive Function Screening: Yes   Immunization & Health Maintenance Record Immunization History  Administered Date(s) Administered  . Fluad Quad(high Dose 65+) 10/18/2018  . Influenza Split 11/21/2012  . Influenza, High Dose Seasonal PF 12/11/2016, 12/13/2017  . Influenza,inj,Quad PF,6+ Mos 12/05/2013, 11/26/2014, 11/20/2015  .  Pneumococcal Conjugate-13 08/22/2014  . Pneumococcal Polysaccharide-23 09/14/2018  . Zoster 02/08/2012    Health Maintenance  Topic Date Due  . COVID-19 Vaccine (1) Never done  . TETANUS/TDAP  03/19/2017  . INFLUENZA VACCINE  09/17/2019  . COLONOSCOPY  12/19/2020  . Hepatitis C Screening  Completed  . PNA vac Low Risk Adult  Completed  . COLON CANCER SCREENING ANNUAL FOBT  Discontinued       Assessment  This is a routine wellness examination for Rc Hensen.  Health Maintenance: Due or Overdue Health Maintenance Due  Topic Date Due  . COVID-19 Vaccine (1) Never done  . TETANUS/TDAP  03/19/2017    Arwin Bangura does not need a referral for Community Assistance: Care Management:   no Social Work:    no Prescription Assistance:  no Nutrition/Diabetes Education:  no   Plan:  Personalized Goals Goals Addressed            This Visit's Progress   . DIET - EAT MORE FRUITS AND VEGETABLES      . Increase physical activity   On track    Pt would like to start back at the local [employer] as soon as the COVID-19 is over    . Patient Stated       Walk instead of riding cart when playing golf      Personalized Health Maintenance & Screening Recommendations  Td vaccine shingles vaccine  Lung Cancer Screening Recommended: no (Low Dose CT Chest recommended if Age 71-80 years, 30 pack-year currently smoking OR have quit w/in past 15 years) Hepatitis C Screening recommended: no HIV Screening recommended: no  Advanced Directives: Written information was not prepared per patient's request.  Referrals & Orders No orders of the defined types were placed in this encounter.   Follow-up Plan . Follow-up with Dettinger, Fransisca Kaufmann, MD as planned on 09/21/19 . Pt is due for shingles vaccine and tetanus, discussed with pt and we will offer at next visit. . Pt has had both covid vaccines. . Pt has no difficulty with hearing. . Pt does have glaucoma and see Dr Katy Fitch every 6 months. Marland Kitchen  Pt  does have a Power of [place], requested for our records. . Pt will increase his walking and make healthier food choices. . Pt voices no healthcare concerns. . AVS printed and mailed to pt .  .    I have personally reviewed and noted the following in the patient's chart:   . Medical and social history . Use of alcohol, tobacco or illicit drugs  . Current medications and supplements . Functional ability and status . Nutritional status . Physical activity . Advanced directives . List of other physicians . Hospitalizations, surgeries, and ER visits in previous 12 months . Vitals . Screenings to include cognitive, depression, and falls . Referrals and appointments  In addition, I have reviewed and discussed with Austin Rota Shepheard certain preventive protocols, quality metrics, and best practice recommendations. A written personalized care plan for preventive services as well as general preventive health recommendations is available and can be mailed to the patient at his request.      Rana Snare, LPN  [phone]

## 2019-08-10 DIAGNOSIS — Z1283 Encounter for screening for malignant neoplasm of skin: Secondary | ICD-10-CM | POA: Diagnosis not present

## 2019-08-10 DIAGNOSIS — D225 Melanocytic nevi of trunk: Secondary | ICD-10-CM | POA: Diagnosis not present

## 2019-08-10 DIAGNOSIS — L820 Inflamed seborrheic keratosis: Secondary | ICD-10-CM | POA: Diagnosis not present

## 2019-09-15 ENCOUNTER — Encounter: Payer: PPO | Admitting: Family Medicine

## 2019-09-18 ENCOUNTER — Other Ambulatory Visit: Payer: Self-pay | Admitting: Family Medicine

## 2019-09-18 ENCOUNTER — Other Ambulatory Visit: Payer: PPO

## 2019-09-18 ENCOUNTER — Other Ambulatory Visit: Payer: Self-pay

## 2019-09-18 DIAGNOSIS — Z8639 Personal history of other endocrine, nutritional and metabolic disease: Secondary | ICD-10-CM | POA: Diagnosis not present

## 2019-09-18 DIAGNOSIS — E7849 Other hyperlipidemia: Secondary | ICD-10-CM

## 2019-09-19 LAB — CMP14+EGFR
ALT: 20 IU/L (ref 0–44)
AST: 22 IU/L (ref 0–40)
Albumin/Globulin Ratio: 1.8 (ref 1.2–2.2)
Albumin: 4.3 g/dL (ref 3.8–4.8)
Alkaline Phosphatase: 66 IU/L (ref 48–121)
BUN/Creatinine Ratio: 10 (ref 10–24)
BUN: 10 mg/dL (ref 8–27)
Bilirubin Total: 0.3 mg/dL (ref 0.0–1.2)
CO2: 26 mmol/L (ref 20–29)
Calcium: 9 mg/dL (ref 8.6–10.2)
Chloride: 106 mmol/L (ref 96–106)
Creatinine, Ser: 1.02 mg/dL (ref 0.76–1.27)
GFR calc Af Amer: 87 mL/min/{1.73_m2} (ref >59)
GFR calc non Af Amer: 75 mL/min/{1.73_m2} (ref >59)
Globulin, Total: 2.4 g/dL (ref 1.5–4.5)
Glucose: 105 mg/dL — ABNORMAL HIGH (ref 65–99)
Potassium: 4.8 mmol/L (ref 3.5–5.2)
Sodium: 142 mmol/L (ref 134–144)
Total Protein: 6.7 g/dL (ref 6.0–8.5)

## 2019-09-19 LAB — LIPID PANEL
Chol/HDL Ratio: 3.3 ratio (ref 0.0–5.0)
Cholesterol, Total: 127 mg/dL (ref 100–199)
HDL: 38 mg/dL — ABNORMAL LOW (ref >39)
LDL Chol Calc (NIH): 71 mg/dL (ref 0–99)
Triglycerides: 97 mg/dL (ref 0–149)
VLDL Cholesterol Cal: 18 mg/dL (ref 5–40)

## 2019-09-19 LAB — CBC WITH DIFFERENTIAL/PLATELET
Basophils Absolute: 0.1 10*3/uL (ref 0.0–0.2)
Basos: 1 %
EOS (ABSOLUTE): 0.2 10*3/uL (ref 0.0–0.4)
Eos: 3 %
Hematocrit: 44.1 % (ref 37.5–51.0)
Hemoglobin: 15.3 g/dL (ref 13.0–17.7)
Immature Grans (Abs): 0 10*3/uL (ref 0.0–0.1)
Immature Granulocytes: 0 %
Lymphocytes Absolute: 1.9 10*3/uL (ref 0.7–3.1)
Lymphs: 37 %
MCH: 34.9 pg — ABNORMAL HIGH (ref 26.6–33.0)
MCHC: 34.7 g/dL (ref 31.5–35.7)
MCV: 101 fL — ABNORMAL HIGH (ref 79–97)
Monocytes Absolute: 0.4 10*3/uL (ref 0.1–0.9)
Monocytes: 8 %
Neutrophils Absolute: 2.6 10*3/uL (ref 1.4–7.0)
Neutrophils: 51 %
Platelets: 139 10*3/uL — ABNORMAL LOW (ref 150–450)
RBC: 4.38 x10E6/uL (ref 4.14–5.80)
RDW: 12.8 % (ref 11.6–15.4)
WBC: 5.2 10*3/uL (ref 3.4–10.8)

## 2019-09-19 LAB — TSH: TSH: 3.73 u[IU]/mL (ref 0.450–4.500)

## 2019-09-21 ENCOUNTER — Ambulatory Visit (INDEPENDENT_AMBULATORY_CARE_PROVIDER_SITE_OTHER): Payer: PPO | Admitting: Family Medicine

## 2019-09-21 ENCOUNTER — Other Ambulatory Visit: Payer: Self-pay

## 2019-09-21 ENCOUNTER — Encounter: Payer: Self-pay | Admitting: Family Medicine

## 2019-09-21 VITALS — BP 118/65 | HR 52 | Temp 98.2°F | Ht 71.0 in | Wt 207.0 lb

## 2019-09-21 DIAGNOSIS — E7849 Other hyperlipidemia: Secondary | ICD-10-CM

## 2019-09-21 DIAGNOSIS — Z0000 Encounter for general adult medical examination without abnormal findings: Secondary | ICD-10-CM

## 2019-09-21 DIAGNOSIS — E559 Vitamin D deficiency, unspecified: Secondary | ICD-10-CM | POA: Diagnosis not present

## 2019-09-21 DIAGNOSIS — Z0001 Encounter for general adult medical examination with abnormal findings: Secondary | ICD-10-CM | POA: Diagnosis not present

## 2019-09-21 MED ORDER — ROSUVASTATIN CALCIUM 40 MG PO TABS: 40.0000 mg | ORAL_TABLET | Freq: Every day | ORAL | 3 refills | Status: DC

## 2019-09-21 MED ORDER — VITAMIN D (ERGOCALCIFEROL) 1.25 MG (50000 UNIT) PO CAPS: 50000.0000 [IU] | ORAL_CAPSULE | ORAL | 3 refills | Status: DC

## 2019-09-21 MED ORDER — OMEGA-3-ACID ETHYL ESTERS 1 G PO CAPS: ORAL_CAPSULE | ORAL | 3 refills | Status: DC

## 2019-09-21 NOTE — Progress Notes (Signed)
BP 118/65   Pulse (!) 52   Temp 98.2 F (36.8 C)   Ht '5\' 11"'$  (1.803 m)   Wt 207 lb (93.9 kg)   SpO2 97%   BMI 28.87 kg/m    Subjective:   Patient ID: Austin Clark, male    DOB: [birth year], 69 y.o.   MRN: [id number]  HPI: Austin Clark is a 69 y.o. male presenting on 09/21/2019 for Medical Management of Chronic Issues   HPI Adult well exam and physical and recheck of chronic issues. Patient is coming in for adult well exam and physical and recheck of chronic issues.  He seems to be doing very well and denies any major health complaints or concerns.  He is starting to try and walk the golf course more and trying to get out. Patient denies any chest pain, shortness of breath, headaches or vision issues, abdominal complaints, diarrhea, nausea, vomiting, or joint issues.   Hyperlipidemia Patient is coming in for recheck of his hyperlipidemia. The patient is currently taking Crestor and fish oil. They deny any issues with myalgias or history of liver damage from it. They deny any focal numbness or weakness or chest pain.   Patient does have a slightly elevated blood sugar on the exam test and recommended that he focus on diet and lifestyle modification and reducing the sugars and reducing carbohydrates and we talked about that extensively.  Relevant past medical, surgical, family and social history reviewed and updated as indicated. Interim medical history since our last visit reviewed. Allergies and medications reviewed and updated.  Review of Systems  Constitutional: Negative for chills and fever.  HENT: Negative for ear pain and tinnitus.   Eyes: Negative for pain and discharge.  Respiratory: Negative for cough, shortness of breath and wheezing.   Cardiovascular: Negative for chest pain, palpitations and leg swelling.  Gastrointestinal: Negative for abdominal pain, blood in stool, constipation and diarrhea.  Genitourinary: Negative for dysuria and hematuria.  Musculoskeletal: Negative  for back pain, gait problem and myalgias.  Skin: Negative for rash.  Neurological: Negative for dizziness, weakness and headaches.  Psychiatric/Behavioral: Negative for suicidal ideas.  All other systems reviewed and are negative.   Per HPI unless specifically indicated above   Allergies as of 09/21/2019      Reactions   Penicillins Rash      Medication List       Accurate as of September 21, 2019 10:52 AM. If you have any questions, ask your nurse or doctor.        dorzolamide-timolol 22.3-6.8 MG/ML ophthalmic solution Commonly known as: COSOPT Place 1 drop into the left eye.   omega-3 acid ethyl esters 1 g capsule Commonly known as: LOVAZA TAKE 2 CAPSULE BY MOUTH TWICE A DAY AS DIRECTED   rosuvastatin 40 MG tablet Commonly known as: CRESTOR Take 1 tablet (40 mg total) by mouth daily.   Vitamin D (Ergocalciferol) 1.25 MG (50000 UNIT) Caps capsule Commonly known as: DRISDOL Take 1 capsule (50,000 Units total) by mouth every 7 (seven) days.   Vyzulta 0.024 % Soln Generic drug: Latanoprostene Bunod Apply to eye.        Objective:   BP 118/65   Pulse (!) 52   Temp 98.2 F (36.8 C)   Ht '5\' 11"'$  (1.803 m)   Wt 207 lb (93.9 kg)   SpO2 97%   BMI 28.87 kg/m   Wt Readings from Last 3 Encounters:  09/21/19 207 lb (93.9 kg)  10/18/18 200 lb (90.7 kg)  09/14/18 197 lb 6.4 oz (89.5 kg)    Physical Exam Vitals and nursing note reviewed.  Constitutional:      General: He is not in acute distress.    Appearance: He is well-developed. He is not diaphoretic.  HENT:     Right Ear: External ear normal.     Left Ear: External ear normal.     Nose: Nose normal.     Mouth/Throat:     Pharynx: No oropharyngeal exudate.  Eyes:     General: No scleral icterus.       Right eye: No discharge.     Conjunctiva/sclera: Conjunctivae normal.     Pupils: Pupils are equal, round, and reactive to light.  Neck:     Thyroid: No thyromegaly.  Cardiovascular:     Rate and Rhythm:  Normal rate and regular rhythm.     Heart sounds: Normal heart sounds. No murmur heard.   Pulmonary:     Effort: Pulmonary effort is normal. No respiratory distress.     Breath sounds: Normal breath sounds. No wheezing.  Abdominal:     General: Bowel sounds are normal. There is no distension.     Palpations: Abdomen is soft.     Tenderness: There is no abdominal tenderness. There is no guarding or rebound.  Genitourinary:    Prostate: Enlarged. Not tender and no nodules present.     Rectum: Normal.  Musculoskeletal:        General: Normal range of motion.     Cervical back: Neck supple.  Lymphadenopathy:     Cervical: No cervical adenopathy.  Skin:    General: Skin is warm and dry.     Findings: No rash.  Neurological:     Mental Status: He is alert and oriented to person, place, and time.     Coordination: Coordination normal.  Psychiatric:        Behavior: Behavior normal.     Results for orders placed or performed in visit on 09/18/19  CBC with Differential/Platelet  Result Value Ref Range   WBC 5.2 3.4 - 10.8 x10E3/uL   RBC 4.38 4.14 - 5.80 x10E6/uL   Hemoglobin 15.3 13.0 - 17.7 g/dL   Hematocrit 44.1 37.5 - 51.0 %   MCV 101 (H) 79 - 97 fL   MCH 34.9 (H) 26.6 - 33.0 pg   MCHC 34.7 31 - 35 g/dL   RDW 12.8 11.6 - 15.4 %   Platelets 139 (L) 150 - 450 x10E3/uL   Neutrophils 51 Not Estab. %   Lymphs 37 Not Estab. %   Monocytes 8 Not Estab. %   Eos 3 Not Estab. %   Basos 1 Not Estab. %   Neutrophils Absolute 2.6 1 - 7 x10E3/uL   Lymphocytes Absolute 1.9 0 - 3 x10E3/uL   Monocytes Absolute 0.4 0 - 0 x10E3/uL   EOS (ABSOLUTE) 0.2 0.0 - 0.4 x10E3/uL   Basophils Absolute 0.1 0 - 0 x10E3/uL   Immature Granulocytes 0 Not Estab. %   Immature Grans (Abs) 0.0 0.0 - 0.1 x10E3/uL  CMP14+EGFR  Result Value Ref Range   Glucose 105 (H) 65 - 99 mg/dL   BUN 10 8 - 27 mg/dL   Creatinine, Ser 1.02 0.76 - 1.27 mg/dL   GFR calc non Af Amer 75 >59 mL/min/1.73   GFR calc Af Amer 87  >59 mL/min/1.73   BUN/Creatinine Ratio 10 10 - 24   Sodium 142 134 - 144 mmol/L   Potassium 4.8 3.5 -  5.2 mmol/L   Chloride 106 96 - 106 mmol/L   CO2 26 20 - 29 mmol/L   Calcium 9.0 8.6 - 10.2 mg/dL   Total Protein 6.7 6.0 - 8.5 g/dL   Albumin 4.3 3.8 - 4.8 g/dL   Globulin, Total 2.4 1.5 - 4.5 g/dL   Albumin/Globulin Ratio 1.8 1.2 - 2.2   Bilirubin Total 0.3 0.0 - 1.2 mg/dL   Alkaline Phosphatase 66 48 - 121 IU/L   AST 22 0 - 40 IU/L   ALT 20 0 - 44 IU/L  Lipid panel  Result Value Ref Range   Cholesterol, Total 127 100 - 199 mg/dL   Triglycerides 97 0 - 149 mg/dL   HDL 38 (L) >39 mg/dL   VLDL Cholesterol Cal 18 5 - 40 mg/dL   LDL Chol Calc (NIH) 71 0 - 99 mg/dL   Chol/HDL Ratio 3.3 0.0 - 5.0 ratio  TSH  Result Value Ref Range   TSH 3.730 0.450 - 4.500 uIU/mL    Assessment & Plan:   Problem List Items Addressed This Visit      Other   Hyperlipidemia (Chronic)   Relevant Medications   omega-3 acid ethyl esters (LOVAZA) 1 g capsule   rosuvastatin (CRESTOR) 40 MG tablet   Other Relevant Orders   Lipid panel   Vitamin D deficiency   Relevant Medications   Vitamin D, Ergocalciferol, (DRISDOL) 1.25 MG (50000 UNIT) CAPS capsule    Other Visit Diagnoses    Well adult exam    -  Primary   Relevant Orders   CBC with Differential/Platelet   CMP14+EGFR    Recommended dietary modification for helping with the sugars, he will make these modifications, also exercise as well.  No changes in medication.  Follow up plan: Return in about 1 year (around 09/20/2020), or if symptoms worsen or fail to improve, for Physical exam.  Counseling provided for all of the vaccine components Orders Placed This Encounter  Procedures  . CBC with Differential/Platelet  . CMP14+EGFR  . Lipid panel    Caryl Pina, MD [facility] 09/21/2019, 10:52 AM

## 2019-10-25 DIAGNOSIS — H401133 Primary open-angle glaucoma, bilateral, severe stage: Secondary | ICD-10-CM | POA: Diagnosis not present

## 2019-10-25 DIAGNOSIS — D492 Neoplasm of unspecified behavior of bone, soft tissue, and skin: Secondary | ICD-10-CM | POA: Diagnosis not present

## 2019-10-25 DIAGNOSIS — Z9889 Other specified postprocedural states: Secondary | ICD-10-CM | POA: Diagnosis not present

## 2019-10-25 DIAGNOSIS — Z961 Presence of intraocular lens: Secondary | ICD-10-CM | POA: Diagnosis not present

## 2019-10-25 DIAGNOSIS — L820 Inflamed seborrheic keratosis: Secondary | ICD-10-CM | POA: Diagnosis not present

## 2019-11-22 ENCOUNTER — Ambulatory Visit (INDEPENDENT_AMBULATORY_CARE_PROVIDER_SITE_OTHER): Payer: PPO

## 2019-11-22 ENCOUNTER — Other Ambulatory Visit: Payer: Self-pay

## 2019-11-22 DIAGNOSIS — Z23 Encounter for immunization: Secondary | ICD-10-CM

## 2019-12-08 DIAGNOSIS — H905 Unspecified sensorineural hearing loss: Secondary | ICD-10-CM | POA: Diagnosis not present

## 2020-02-27 DIAGNOSIS — Z961 Presence of intraocular lens: Secondary | ICD-10-CM | POA: Diagnosis not present

## 2020-02-27 DIAGNOSIS — H401133 Primary open-angle glaucoma, bilateral, severe stage: Secondary | ICD-10-CM | POA: Diagnosis not present

## 2020-02-27 DIAGNOSIS — Z9889 Other specified postprocedural states: Secondary | ICD-10-CM | POA: Diagnosis not present

## 2020-04-01 ENCOUNTER — Encounter: Payer: Self-pay | Admitting: Family Medicine

## 2020-04-03 MED ORDER — OMEGA-3 1000 MG PO CAPS: 2.0000 | ORAL_CAPSULE | Freq: Two times a day (BID) | ORAL | 3 refills | Status: DC

## 2020-07-03 DIAGNOSIS — H401133 Primary open-angle glaucoma, bilateral, severe stage: Secondary | ICD-10-CM | POA: Diagnosis not present

## 2020-07-03 DIAGNOSIS — Z961 Presence of intraocular lens: Secondary | ICD-10-CM | POA: Diagnosis not present

## 2020-07-03 DIAGNOSIS — Z9889 Other specified postprocedural states: Secondary | ICD-10-CM | POA: Diagnosis not present

## 2020-09-23 ENCOUNTER — Ambulatory Visit (INDEPENDENT_AMBULATORY_CARE_PROVIDER_SITE_OTHER): Payer: Medicare HMO | Admitting: Family Medicine

## 2020-09-23 ENCOUNTER — Other Ambulatory Visit: Payer: Self-pay

## 2020-09-23 ENCOUNTER — Other Ambulatory Visit: Payer: Self-pay | Admitting: *Deleted

## 2020-09-23 ENCOUNTER — Encounter: Payer: Self-pay | Admitting: Family Medicine

## 2020-09-23 VITALS — BP 112/61 | HR 45 | Ht 71.0 in | Wt 202.0 lb

## 2020-09-23 DIAGNOSIS — N400 Enlarged prostate without lower urinary tract symptoms: Secondary | ICD-10-CM | POA: Diagnosis not present

## 2020-09-23 DIAGNOSIS — E559 Vitamin D deficiency, unspecified: Secondary | ICD-10-CM | POA: Diagnosis not present

## 2020-09-23 DIAGNOSIS — Z0001 Encounter for general adult medical examination with abnormal findings: Secondary | ICD-10-CM | POA: Diagnosis not present

## 2020-09-23 DIAGNOSIS — Z8639 Personal history of other endocrine, nutritional and metabolic disease: Secondary | ICD-10-CM

## 2020-09-23 DIAGNOSIS — E7849 Other hyperlipidemia: Secondary | ICD-10-CM | POA: Diagnosis not present

## 2020-09-23 DIAGNOSIS — Z0000 Encounter for general adult medical examination without abnormal findings: Secondary | ICD-10-CM

## 2020-09-23 DIAGNOSIS — Z23 Encounter for immunization: Secondary | ICD-10-CM

## 2020-09-23 MED ORDER — ROSUVASTATIN CALCIUM 40 MG PO TABS: 40.0000 mg | ORAL_TABLET | Freq: Every day | ORAL | 3 refills | Status: DC

## 2020-09-23 NOTE — Progress Notes (Signed)
BP 112/61   Pulse (!) 45   Ht '5\' 11"'$  (1.803 m)   Wt 202 lb (91.6 kg)   SpO2 97%   BMI 28.17 kg/m    Subjective:   Patient ID: Austin Clark, male    DOB: December 31, 1950, 70 y.o.   MRN: [id number]  HPI: Austin Clark is a 70 y.o. male presenting on 09/23/2020 for Medical Management of Chronic Issues (CPE)   HPI Physical exam and recheck of chronic medical issues Patient denies any chest pain, shortness of breath, headaches or vision issues, abdominal complaints, diarrhea, nausea, vomiting, or joint issues.  She gets occasional joint aches but otherwise is doing very well.  Blood pressure looks good.  Relevant past medical, surgical, family and social history reviewed and updated as indicated. Interim medical history since our last visit reviewed. Allergies and medications reviewed and updated.  Review of Systems  Constitutional:  Negative for chills and fever.  HENT:  Negative for ear pain and tinnitus.   Eyes:  Negative for pain and discharge.  Respiratory:  Negative for cough, shortness of breath and wheezing.   Cardiovascular:  Negative for chest pain, palpitations and leg swelling.  Gastrointestinal:  Negative for abdominal pain, blood in stool, constipation and diarrhea.  Genitourinary:  Negative for dysuria and hematuria.  Musculoskeletal:  Negative for back pain, gait problem and myalgias.  Skin:  Negative for rash.  Neurological:  Negative for dizziness, weakness and headaches.  Psychiatric/Behavioral:  Negative for suicidal ideas.   All other systems reviewed and are negative.  Per HPI unless specifically indicated above   Allergies as of 09/23/2020       Reactions   Penicillins Rash        Medication List        Accurate as of September 23, 2020 10:23 AM. If you have any questions, ask your nurse or doctor.          dorzolamide-timolol 22.3-6.8 MG/ML ophthalmic solution Commonly known as: COSOPT Place 1 drop into the left eye.   Omega-3 1000 MG Caps Take 2  capsules (2,000 mg total) by mouth in the morning and at bedtime.   rosuvastatin 40 MG tablet Commonly known as: CRESTOR Take 1 tablet (40 mg total) by mouth daily.   Vitamin D (Ergocalciferol) 1.25 MG (50000 UNIT) Caps capsule Commonly known as: DRISDOL Take 1 capsule (50,000 Units total) by mouth every 7 (seven) days.   Vyzulta 0.024 % Soln Generic drug: Latanoprostene Bunod Apply to eye.         Objective:   BP 112/61   Pulse (!) 45   Ht '5\' 11"'$  (1.803 m)   Wt 202 lb (91.6 kg)   SpO2 97%   BMI 28.17 kg/m   Wt Readings from Last 3 Encounters:  09/23/20 202 lb (91.6 kg)  09/21/19 207 lb (93.9 kg)  10/18/18 200 lb (90.7 kg)    Physical Exam Vitals reviewed.  Constitutional:      General: He is not in acute distress.    Appearance: He is well-developed. He is not diaphoretic.  HENT:     Right Ear: External ear normal.     Left Ear: External ear normal.     Nose: Nose normal.     Mouth/Throat:     Pharynx: No oropharyngeal exudate.  Eyes:     General: No scleral icterus.       Right eye: No discharge.     Conjunctiva/sclera: Conjunctivae normal.     Pupils: Pupils are  equal, round, and reactive to light.  Neck:     Thyroid: No thyromegaly.  Cardiovascular:     Rate and Rhythm: Normal rate and regular rhythm.     Heart sounds: Normal heart sounds. No murmur heard. Pulmonary:     Effort: Pulmonary effort is normal. No respiratory distress.     Breath sounds: Normal breath sounds. No wheezing.  Abdominal:     General: Bowel sounds are normal. There is no distension.     Palpations: Abdomen is soft.     Tenderness: There is no abdominal tenderness. There is no guarding or rebound.  Musculoskeletal:        General: Normal range of motion.     Cervical back: Neck supple.  Lymphadenopathy:     Cervical: No cervical adenopathy.  Skin:    General: Skin is warm and dry.     Findings: No rash.  Neurological:     Mental Status: He is alert and oriented to  person, place, and time.     Coordination: Coordination normal.  Psychiatric:        Behavior: Behavior normal.      Assessment & Plan:   Problem List Items Addressed This Visit       Genitourinary   BPH (benign prostatic hyperplasia) - Primary (Chronic)   Relevant Orders   CBC with Differential/Platelet   PSA, total and free     Other   Hyperlipidemia (Chronic)   Relevant Medications   rosuvastatin (CRESTOR) 40 MG tablet   Other Relevant Orders   CBC with Differential/Platelet   CMP14+EGFR   Lipid panel   History of hypothyroidism (Chronic)   Relevant Orders   TSH   Vitamin D deficiency   Relevant Orders   VITAMIN D 25 Hydroxy (Vit-D Deficiency, Fractures)   Other Visit Diagnoses     Need for Tdap vaccination       Relevant Orders   Tdap vaccine greater than or equal to 7yo IM (Completed)       Will check blood work, continue cholesterol medication, will get colonoscopy later this year Follow up plan: Return in about 1 year (around 09/23/2021), or if symptoms worsen or fail to improve, for Physical exam recheck cholesterol prostate.  Counseling provided for all of the vaccine components Orders Placed This Encounter  Procedures   Tdap vaccine greater than or equal to 7yo IM   CBC with Differential/Platelet   CMP14+EGFR   Lipid panel   TSH   VITAMIN D 25 Hydroxy (Vit-D Deficiency, Fractures)   PSA, total and free    Caryl Pina, MD [facility] 09/23/2020, 10:23 AM

## 2020-09-24 LAB — TSH: TSH: 4.15 u[IU]/mL (ref 0.450–4.500)

## 2020-09-24 LAB — CMP14+EGFR
ALT: 19 IU/L (ref 0–44)
AST: 21 IU/L (ref 0–40)
Albumin/Globulin Ratio: 2.8 — ABNORMAL HIGH (ref 1.2–2.2)
Albumin: 4.4 g/dL (ref 3.8–4.8)
Alkaline Phosphatase: 64 IU/L (ref 44–121)
BUN/Creatinine Ratio: 9 — ABNORMAL LOW (ref 10–24)
BUN: 10 mg/dL (ref 8–27)
Bilirubin Total: 0.4 mg/dL (ref 0.0–1.2)
CO2: 22 mmol/L (ref 20–29)
Calcium: 9.2 mg/dL (ref 8.6–10.2)
Chloride: 107 mmol/L — ABNORMAL HIGH (ref 96–106)
Creatinine, Ser: 1.11 mg/dL (ref 0.76–1.27)
Globulin, Total: 1.6 g/dL (ref 1.5–4.5)
Glucose: 104 mg/dL — ABNORMAL HIGH (ref 65–99)
Potassium: 4.6 mmol/L (ref 3.5–5.2)
Sodium: 143 mmol/L (ref 134–144)
Total Protein: 6 g/dL (ref 6.0–8.5)
eGFR: 72 mL/min/{1.73_m2} (ref >59)

## 2020-09-24 LAB — CBC WITH DIFFERENTIAL/PLATELET
Basophils Absolute: 0.1 10*3/uL (ref 0.0–0.2)
Basos: 1 %
EOS (ABSOLUTE): 0.1 10*3/uL (ref 0.0–0.4)
Eos: 2 %
Hematocrit: 43.4 % (ref 37.5–51.0)
Hemoglobin: 14.6 g/dL (ref 13.0–17.7)
Immature Grans (Abs): 0 10*3/uL (ref 0.0–0.1)
Immature Granulocytes: 0 %
Lymphocytes Absolute: 1.7 10*3/uL (ref 0.7–3.1)
Lymphs: 37 %
MCH: 34 pg — ABNORMAL HIGH (ref 26.6–33.0)
MCHC: 33.6 g/dL (ref 31.5–35.7)
MCV: 101 fL — ABNORMAL HIGH (ref 79–97)
Monocytes Absolute: 0.3 10*3/uL (ref 0.1–0.9)
Monocytes: 6 %
Neutrophils Absolute: 2.5 10*3/uL (ref 1.4–7.0)
Neutrophils: 54 %
Platelets: 130 10*3/uL — ABNORMAL LOW (ref 150–450)
RBC: 4.3 x10E6/uL (ref 4.14–5.80)
RDW: 12.6 % (ref 11.6–15.4)
WBC: 4.6 10*3/uL (ref 3.4–10.8)

## 2020-09-24 LAB — LIPID PANEL
Chol/HDL Ratio: 3.4 ratio (ref 0.0–5.0)
Cholesterol, Total: 118 mg/dL (ref 100–199)
HDL: 35 mg/dL — ABNORMAL LOW (ref >39)
LDL Chol Calc (NIH): 64 mg/dL (ref 0–99)
Triglycerides: 100 mg/dL (ref 0–149)
VLDL Cholesterol Cal: 19 mg/dL (ref 5–40)

## 2020-09-24 LAB — VITAMIN D 25 HYDROXY (VIT D DEFICIENCY, FRACTURES): Vit D, 25-Hydroxy: 66.2 ng/mL (ref 30.0–100.0)

## 2020-09-24 LAB — PSA, TOTAL AND FREE
PSA, Free Pct: 25 %
PSA, Free: 0.2 ng/mL
Prostate Specific Ag, Serum: 0.8 ng/mL (ref 0.0–4.0)

## 2020-10-28 ENCOUNTER — Encounter: Payer: Self-pay | Admitting: Internal Medicine

## 2020-10-29 ENCOUNTER — Telehealth: Payer: Self-pay | Admitting: Family Medicine

## 2020-10-29 NOTE — Telephone Encounter (Signed)
Please let the patient know that I still would like him to have an annual wellness visit every year and his insurance does recommended but it is always his choice

## 2020-10-29 NOTE — Telephone Encounter (Signed)
Patient declined the Medicare Wellness Visit with [facility]   Felt he didn't need to complete since he has had his physical with PCP.

## 2020-10-30 NOTE — Telephone Encounter (Signed)
Spoke with pt and made him aware of Dettinger's recommendations. He agreed to go ahead and schedule in [facility] AW visit. He would like to schedule in October or November.

## 2020-11-02 IMAGING — DX DG CHEST 2V
2 series · 2 of 2 positions shown · non-contrast
Comparison: 11/25/2015 chest radiograph.

CLINICAL DATA: Annual physical examination. Aortic atherosclerosis.

EXAM:
CHEST - 2 VIEW

[chest pa]
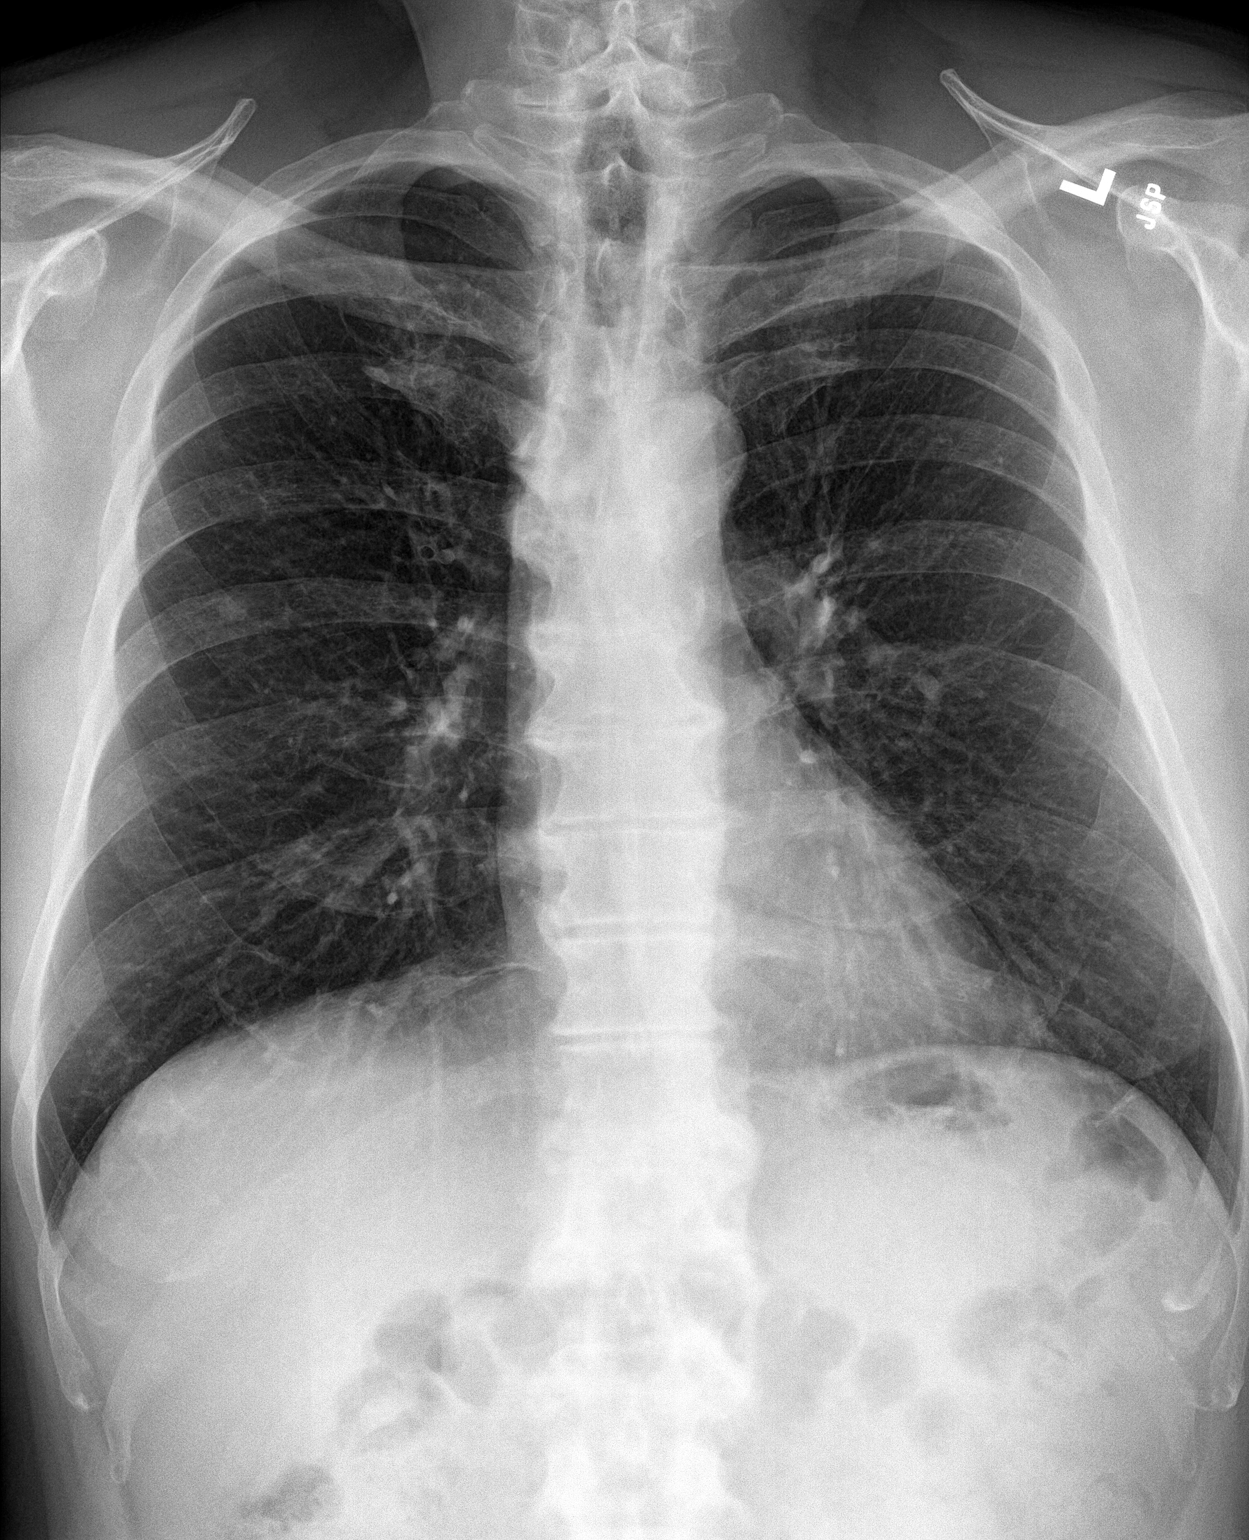

[chest lat]
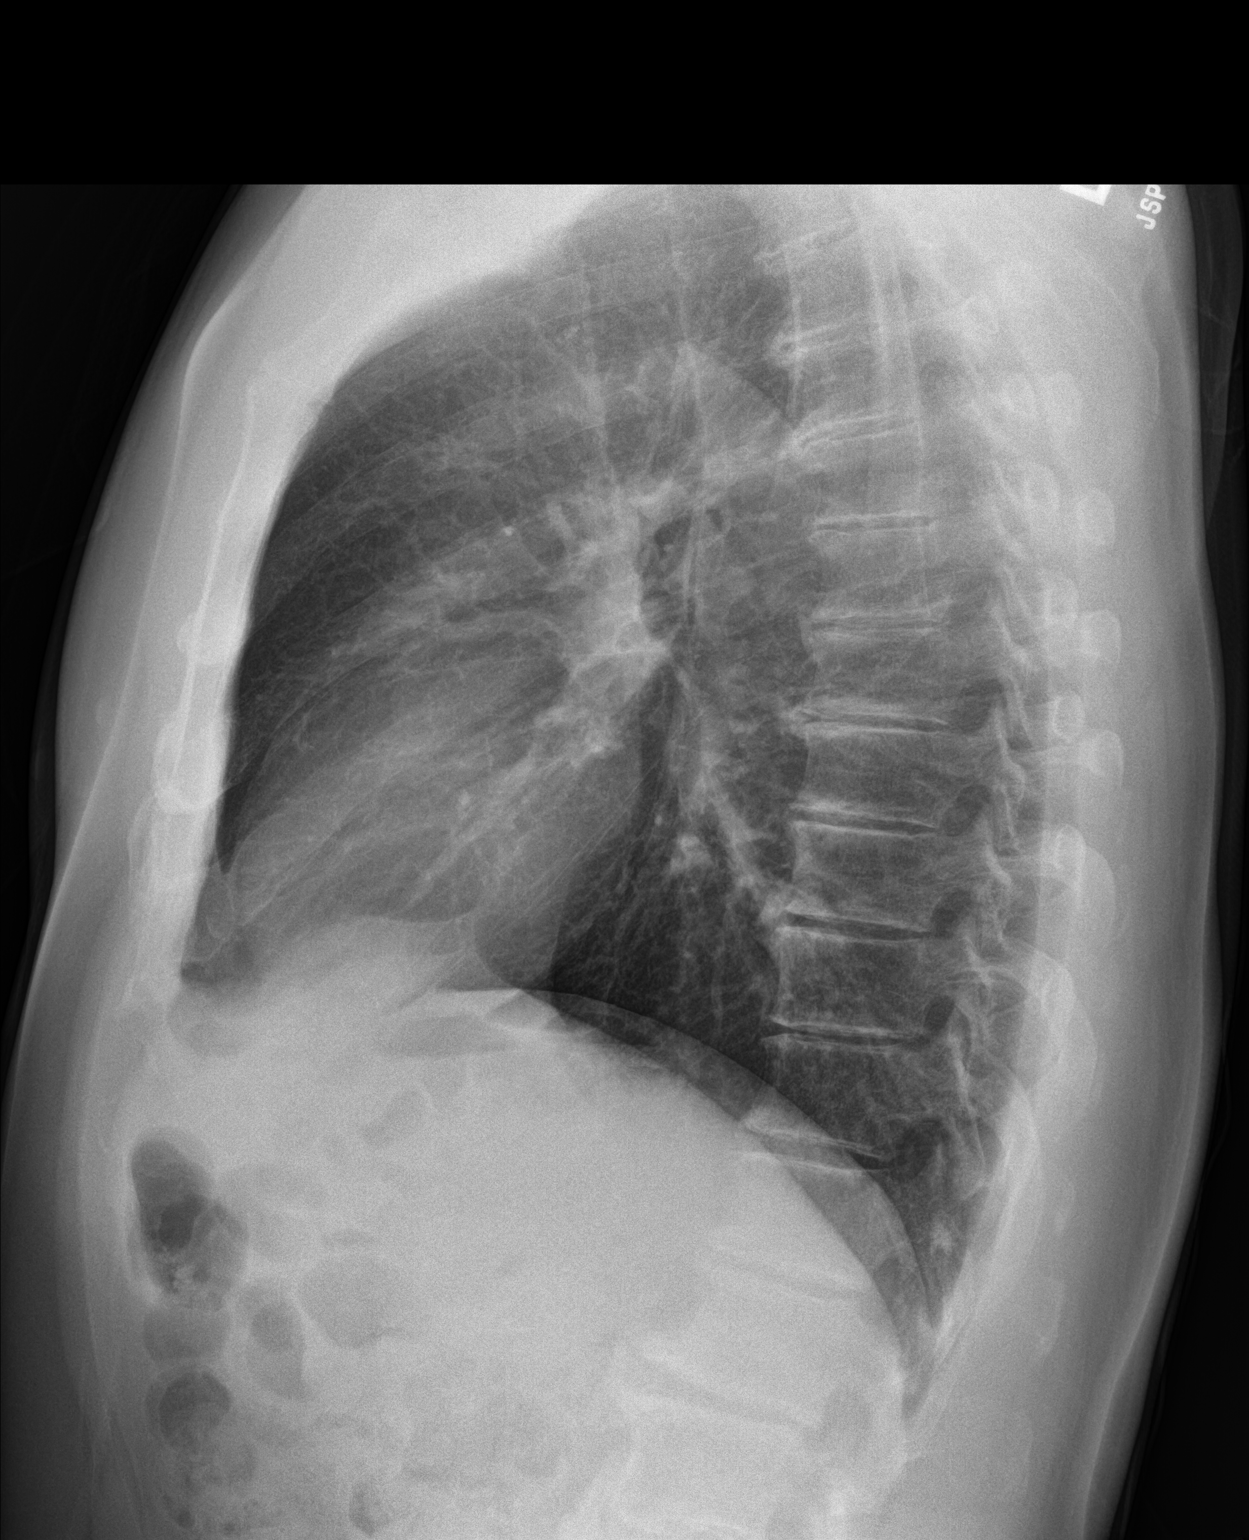

[2 of 2 positions shown; findings below may reference images not displayed]

FINDINGS: Stable cardiomediastinal silhouette with normal heart size. No
pneumothorax. No pleural effusion. Stable 7 mm right midlung
pulmonary nodule, as seen on 2067 chest CT. No pulmonary edema. No
acute consolidative airspace disease.
IMPRESSION: No active cardiopulmonary disease.

## 2020-11-11 DIAGNOSIS — H401133 Primary open-angle glaucoma, bilateral, severe stage: Secondary | ICD-10-CM | POA: Diagnosis not present

## 2020-11-11 DIAGNOSIS — Z9889 Other specified postprocedural states: Secondary | ICD-10-CM | POA: Diagnosis not present

## 2020-11-11 DIAGNOSIS — Z961 Presence of intraocular lens: Secondary | ICD-10-CM | POA: Diagnosis not present

## 2020-12-12 ENCOUNTER — Ambulatory Visit (INDEPENDENT_AMBULATORY_CARE_PROVIDER_SITE_OTHER): Payer: Medicare HMO

## 2020-12-12 VITALS — Ht 71.0 in | Wt 202.0 lb

## 2020-12-12 DIAGNOSIS — Z0000 Encounter for general adult medical examination without abnormal findings: Secondary | ICD-10-CM

## 2020-12-12 NOTE — Patient Instructions (Signed)
Mr. Austin Clark , Thank you for taking time to come for your Medicare Wellness Visit. I appreciate your ongoing commitment to your health goals. Please review the following plan we discussed and let me know if I can assist you in the future.   Screening recommendations/referrals: Colonoscopy: Done 12/20/2015 - Repeat in 5 years *appointment 12/27/2020 with Pyrtle Recommended yearly ophthalmology/optometry visit for glaucoma screening and checkup Recommended yearly dental visit for hygiene and checkup  Vaccinations: Influenza vaccine: Done 11/21/2020 -Repeat annually Pneumococcal vaccine: Done 08/22/2014 & 09/14/2018 Tdap vaccine: Done 09/23/2020 - Repeat in 10 years Shingles vaccine: Zostavax done 2013 - Get Shingrix in 2023   Covid-19: Done 03/30/2019, 04/28/2019, & 12/12/2019  Advanced directives: Please bring a copy of your health care power of attorney and living will to the office to be added to your chart at your convenience.   Conditions/risks identified: Aim for 30 minutes of exercise or brisk walking each day, drink 6-8 glasses of water and eat lots of fruits and vegetables.   Next appointment: Follow up in one year for your annual wellness visit.   Preventive Care 71 Years and Older, Male  Preventive care refers to lifestyle choices and visits with your health care provider that can promote health and wellness. What does preventive care include? A yearly physical exam. This is also called an annual well check. Dental exams once or twice a year. Routine eye exams. Ask your health care provider how often you should have your eyes checked. Personal lifestyle choices, including: Daily care of your teeth and gums. Regular physical activity. Eating a healthy diet. Avoiding tobacco and drug use. Limiting alcohol use. Practicing safe sex. Taking low doses of aspirin every day. Taking vitamin and mineral supplements as recommended by your health care provider. What happens during an annual well  check? The services and screenings done by your health care provider during your annual well check will depend on your age, overall health, lifestyle risk factors, and family history of disease. Counseling  Your health care provider may ask you questions about your: Alcohol use. Tobacco use. Drug use. Emotional well-being. Home and relationship well-being. Sexual activity. Eating habits. History of falls. Memory and ability to understand (cognition). Work and work Statistician. Screening  You may have the following tests or measurements: Height, weight, and BMI. Blood pressure. Lipid and cholesterol levels. These may be checked every 5 years, or more frequently if you are over 17 years old. Skin check. Lung cancer screening. You may have this screening every year starting at age 29 if you have a 30-pack-year history of smoking and currently smoke or have quit within the past 15 years. Fecal occult blood test (FOBT) of the stool. You may have this test every year starting at age 19. Flexible sigmoidoscopy or colonoscopy. You may have a sigmoidoscopy every 5 years or a colonoscopy every 10 years starting at age 108. Prostate cancer screening. Recommendations will vary depending on your family history and other risks. Hepatitis C blood test. Hepatitis B blood test. Sexually transmitted disease (STD) testing. Diabetes screening. This is done by checking your blood sugar (glucose) after you have not eaten for a while (fasting). You may have this done every 1-3 years. Abdominal aortic aneurysm (AAA) screening. You may need this if you are a current or former smoker. Osteoporosis. You may be screened starting at age 40 if you are at high risk. Talk with your health care provider about your test results, treatment options, and if necessary, the need  for more tests. Vaccines  Your health care provider may recommend certain vaccines, such as: Influenza vaccine. This is recommended every  year. Tetanus, diphtheria, and acellular pertussis (Tdap, Td) vaccine. You may need a Td booster every 10 years. Zoster vaccine. You may need this after age 79. Pneumococcal 13-valent conjugate (PCV13) vaccine. One dose is recommended after age 81. Pneumococcal polysaccharide (PPSV23) vaccine. One dose is recommended after age 70. Talk to your health care provider about which screenings and vaccines you need and how often you need them. This information is not intended to replace advice given to you by your health care provider. Make sure you discuss any questions you have with your health care provider. Document Released: 03/01/2015 Document Revised: 10/23/2015 Document Reviewed: 12/04/2014 Elsevier Interactive Patient Education  2017 [place] Prevention in the Home Falls can cause injuries. They can happen to people of all ages. There are many things you can do to make your home safe and to help prevent falls. What can I do on the outside of my home? Regularly fix the edges of walkways and driveways and fix any cracks. Remove anything that might make you trip as you walk through a door, such as a raised step or threshold. Trim any bushes or trees on the path to your home. Use bright outdoor lighting. Clear any walking paths of anything that might make someone trip, such as rocks or tools. Regularly check to see if handrails are loose or broken. Make sure that both sides of any steps have handrails. Any raised decks and porches should have guardrails on the edges. Have any leaves, snow, or ice cleared regularly. Use sand or salt on walking paths during winter. Clean up any spills in your garage right away. This includes oil or grease spills. What can I do in the bathroom? Use night lights. Install grab bars by the toilet and in the tub and shower. Do not use towel bars as grab bars. Use non-skid mats or decals in the tub or shower. If you need to sit down in the shower, use a  plastic, non-slip stool. Keep the floor dry. Clean up any water that spills on the floor as soon as it happens. Remove soap buildup in the tub or shower regularly. Attach bath mats securely with double-sided non-slip rug tape. Do not have throw rugs and other things on the floor that can make you trip. What can I do in the bedroom? Use night lights. Make sure that you have a light by your bed that is easy to reach. Do not use any sheets or blankets that are too big for your bed. They should not hang down onto the floor. Have a firm chair that has side arms. You can use this for support while you get dressed. Do not have throw rugs and other things on the floor that can make you trip. What can I do in the kitchen? Clean up any spills right away. Avoid walking on wet floors. Keep items that you use a lot in easy-to-reach places. If you need to reach something above you, use a strong step stool that has a grab bar. Keep electrical cords out of the way. Do not use floor polish or wax that makes floors slippery. If you must use wax, use non-skid floor wax. Do not have throw rugs and other things on the floor that can make you trip. What can I do with my stairs? Do not leave any items on the stairs.  Make sure that there are handrails on both sides of the stairs and use them. Fix handrails that are broken or loose. Make sure that handrails are as long as the stairways. Check any carpeting to make sure that it is firmly attached to the stairs. Fix any carpet that is loose or worn. Avoid having throw rugs at the top or bottom of the stairs. If you do have throw rugs, attach them to the floor with carpet tape. Make sure that you have a light switch at the top of the stairs and the bottom of the stairs. If you do not have them, ask someone to add them for you. What else can I do to help prevent falls? Wear shoes that: Do not have high heels. Have rubber bottoms. Are comfortable and fit you  well. Are closed at the toe. Do not wear sandals. If you use a stepladder: Make sure that it is fully opened. Do not climb a closed stepladder. Make sure that both sides of the stepladder are locked into place. Ask someone to hold it for you, if possible. Clearly mark and make sure that you can see: Any grab bars or handrails. First and last steps. Where the edge of each step is. Use tools that help you move around (mobility aids) if they are needed. These include: Canes. Walkers. Scooters. Crutches. Turn on the lights when you go into a dark area. Replace any light bulbs as soon as they burn out. Set up your furniture so you have a clear path. Avoid moving your furniture around. If any of your floors are uneven, fix them. If there are any pets around you, be aware of where they are. Review your medicines with your doctor. Some medicines can make you feel dizzy. This can increase your chance of falling. Ask your doctor what other things that you can do to help prevent falls. This information is not intended to replace advice given to you by your health care provider. Make sure you discuss any questions you have with your health care provider. Document Released: 11/29/2008 Document Revised: 07/11/2015 Document Reviewed: 03/09/2014 Elsevier Interactive Patient Education  2017 [employer].

## 2020-12-12 NOTE — Progress Notes (Signed)
Subjective:   Austin Clark is a 70 y.o. male who presents for Medicare Annual/Subsequent preventive examination.  Virtual Visit via Telephone Note  I connected with  Austin Clark on 12/12/20 at 10:30 AM EDT by telephone and verified that I am speaking with the correct person using two identifiers.  Location: Patient: Home Provider: [facility] Persons participating in the virtual visit: patient/Nurse Health Advisor   I discussed the limitations, risks, security and privacy concerns of performing an evaluation and management service by telephone and the availability of in person appointments. The patient expressed understanding and agreed to proceed.  Interactive audio and video telecommunications were attempted between this nurse and patient, however failed, due to patient having technical difficulties OR patient did not have access to video capability.  We continued and completed visit with audio only.  Some vital signs may be absent or patient reported.   Austin Clark E Khadija Thier, LPN   Review of Systems     Cardiac Risk Factors include: advanced age (>67men, >52 women);male gender;dyslipidemia;sedentary lifestyle     Objective:    Today's Vitals   12/12/20 1039  Weight: 202 lb (91.6 kg)  Height: 5\' 11"  (1.803 m)   Body mass index is 28.17 kg/m.  Advanced Directives 12/12/2020 07/06/2019 07/05/2018  Does Patient Have a Medical Advance Directive? No Yes No  Type of Advance Directive - [place] -  Does patient want to make changes to medical advance directive? - No - Patient declined -  Copy of [place] in Chart? - No - copy requested -  Would patient like information on creating a medical advance directive? No - Patient declined - No - Patient declined    Current Medications (verified) Outpatient Encounter Medications as of 12/12/2020  Medication Sig   dorzolamide-timolol (COSOPT) 22.3-6.8 MG/ML ophthalmic solution Place 1 drop into the left eye.     Latanoprostene Bunod (VYZULTA) 0.024 % SOLN Apply to eye.   Omega-3 1000 MG CAPS Take 2 capsules (2,000 mg total) by mouth in the morning and at bedtime.   rosuvastatin (CRESTOR) 40 MG tablet Take 1 tablet (40 mg total) by mouth daily.   Vitamin D, Ergocalciferol, (DRISDOL) 1.25 MG (50000 UNIT) CAPS capsule Take 1 capsule (50,000 Units total) by mouth every 7 (seven) days.   No facility-administered encounter medications on file as of 12/12/2020.    Allergies (verified) Penicillins   History: Past Medical History:  Diagnosis Date   BPH (benign prostatic hypertrophy)    Colon polyps    Cough    Glaucoma 1993   Hyperlipidemia    Hypothyroid    Knee pain    Pulmonary nodule    Tobacco use    Past Surgical History:  Procedure Laterality Date   EYE SURGERY Bilateral    cataract   knee surgery Right    "cleaned"   LASER SURGERY     GLAUCOMA    TONSILLECTOMY     Family History  Problem Relation Age of Onset   Cancer Father        colon cancer   Heart disease Mother    Social History   Socioeconomic History   Marital status: Married    Spouse name: [name]   Number of children: 2   Years of education: 12   Highest education level: High school graduate  Occupational History   Occupation: fulltime    Employer: [employer]   Occupation: Retired  Tobacco Use   Smoking status: Former  Packs/day: 1.00    Types: Cigarettes    Start date: 02/17/1967    Quit date: 02/16/1997    Years since quitting: 23.8   Smokeless tobacco: Never   Tobacco comments:    Rare cigarettes  Vaping Use   Vaping Use: Never used  Substance and Sexual Activity   Alcohol use: Not Currently    Comment: previous   Drug use: No   Sexual activity: Yes    Birth control/protection: None  Other Topics Concern   Not on file  Social History Narrative   Lives in 2 story home with wife   Social Determinants of Health   Financial Resource Strain: Low Risk    Difficulty of Paying  Living Expenses: Not hard at all  Food Insecurity: No Food Insecurity   Worried About Charity fundraiser in the Last Year: Never true   Arboriculturist in the Last Year: Never true  Transportation Needs: No Transportation Needs   Lack of Transportation (Medical): No   Lack of Transportation (Non-Medical): No  Physical Activity: Insufficiently Active   Days of Exercise per Week: 2 days   Minutes of Exercise per Session: 60 min  Stress: No Stress Concern Present   Feeling of Stress : Not at all  Social Connections: Socially Integrated   Frequency of Communication with Friends and Family: Twice a week   Frequency of Social Gatherings with Friends and Family: Twice a week   Attends Religious Services: More than 4 times per year   Active Member of [employer] or Organizations: Yes   Attends Music therapist: More than 4 times per year   Marital Status: Married    Tobacco Counseling Counseling given: Not Answered Tobacco comments: Rare cigarettes   Clinical Intake:  Pre-visit preparation completed: Yes  Pain : No/denies pain     BMI - recorded: 28.17 Nutritional Status: BMI 25 -29 Overweight Nutritional Risks: None Diabetes: No  How often do you need to have someone help you when you read instructions, pamphlets, or other written materials from your doctor or pharmacy?: 1 - Never  Diabetic? no  Interpreter Needed?: No  Information entered by :: Austin Rosamond, LPN   Activities of Daily Living In your present state of health, do you have any difficulty performing the following activities: 12/12/2020  Hearing? N  Vision? N  Difficulty concentrating or making decisions? N  Walking or climbing stairs? N  Dressing or bathing? N  Doing errands, shopping? N  Preparing Food and eating ? N  Using the Toilet? N  In the past six months, have you accidently leaked urine? N  Do you have problems with loss of bowel control? N  Managing your Medications? N  Managing your  Finances? N  Housekeeping or managing your Housekeeping? N  Some recent data might be hidden    Patient Care Team: Dettinger, Fransisca Kaufmann, MD as PCP - General (Family Medicine) Clent Jacks, MD (Ophthalmology)  Indicate any recent Medical Services you may have received from other than [facility] providers in the past year (date may be approximate).     Assessment:   This is a routine wellness examination for Austin Clark.  Hearing/Vision screen Hearing Screening - Comments:: C/o mild hearing difficulties - declines hearing aids Vision Screening - Comments:: Up to date with annual eye exams with Dr Katy Fitch - has glaucoma, under control. No rx glasses  Dietary issues and exercise activities discussed: Current Exercise Habits: Home exercise routine, Type of exercise: walking;strength training/weights,  Time (Minutes): 60, Frequency (Times/Week): 2, Weekly Exercise (Minutes/Week): 120, Intensity: Moderate, Exercise limited by: None identified   Goals Addressed             This Visit's Progress    Increase physical activity   On track    Pt would like to start back at the local [employer] as soon as the COVID-19 is over       Depression Screen PHQ 2/9 Scores 12/12/2020 09/23/2020 09/21/2019 07/06/2019 10/18/2018 10/18/2018 09/14/2018  PHQ - 2 Score 0 0 0 0 0 0 0    Fall Risk Fall Risk  12/12/2020 09/23/2020 09/21/2019 07/06/2019 10/18/2018  Falls in the past year? 0 0 0 0 0  Number falls in past yr: 0 - - - -  Injury with Fall? 0 - - - -  Risk for fall due to : No Fall Risks - - - -  Follow up Falls prevention discussed - - - -    FALL RISK PREVENTION PERTAINING TO THE HOME:  Any stairs in or around the home? No  If so, are there any without handrails? No  Home free of loose throw rugs in walkways, pet beds, electrical cords, etc? Yes  Adequate lighting in your home to reduce risk of falls? Yes   ASSISTIVE DEVICES UTILIZED TO PREVENT FALLS:  Life alert? No  Use of a cane, walker or w/c? No  Grab bars  in the bathroom? No  Shower chair or bench in shower? No  Elevated toilet seat or a handicapped toilet? No   TIMED UP AND GO:  Was the test performed? No . Telephonic visit.  Cognitive Function: Normal cognitive status assessed by direct observation by this Nurse Health Advisor. No abnormalities found.       6CIT Screen 07/06/2019 07/05/2018  What Year? 0 points -  What month? 0 points 0 points  What time? 0 points 0 points  Count back from 20 0 points 2 points  Months in reverse 0 points 0 points  Repeat phrase 0 points 0 points  Total Score 0 -    Immunizations Immunization History  Administered Date(s) Administered   Fluad Quad(high Dose 65+) 10/18/2018, 11/22/2019   Influenza Split 11/21/2012   Influenza, High Dose Seasonal PF 12/11/2016, 12/13/2017, 11/21/2020   Influenza,inj,Quad PF,6+ Mos 12/05/2013, 11/26/2014, 11/20/2015   Moderna Sars-Covid-2 Vaccination 03/30/2019, 04/28/2019, 12/12/2019   Pneumococcal Conjugate-13 08/22/2014   Pneumococcal Polysaccharide-23 09/14/2018   Tdap 09/23/2020   Zoster, Live 02/08/2012    TDAP status: Up to date  Flu Vaccine status: Up to date  Pneumococcal vaccine status: Up to date  Covid-19 vaccine status: Completed vaccines  Qualifies for Shingles Vaccine? Yes   Zostavax completed Yes   Shingrix Completed?: No.    Education has been provided regarding the importance of this vaccine. Patient has been advised to call insurance company to determine out of pocket expense if they have not yet received this vaccine. Advised may also receive vaccine at local pharmacy or Health Dept. Verbalized acceptance and understanding.  Screening Tests Health Maintenance  Topic Date Due   COVID-19 Vaccine (4 - Booster for Moderna series) 09/22/2021 (Originally 02/06/2020)   Zoster Vaccines- Shingrix (1 of 2) 12/22/2021 (Originally 09/06/2001)   COLONOSCOPY (Pts 45-48yrs Insurance coverage will need to be confirmed)  12/19/2020   TETANUS/TDAP   09/24/2030   Pneumonia Vaccine 61+ Years old  Completed   INFLUENZA VACCINE  Completed   Hepatitis C Screening  Completed   HPV VACCINES  Aged  Out   COLON CANCER SCREENING ANNUAL FOBT  Discontinued    Health Maintenance  There are no preventive care reminders to display for this patient.  Colorectal cancer screening: Referral to GI placed 11/2020. Pt aware the office will call re: appt.  Lung Cancer Screening: (Low Dose CT Chest recommended if Age 2-80 years, 30 pack-year currently smoking OR have quit w/in 15years.) does not qualify.   Additional Screening:  Hepatitis C Screening: does qualify; Completed 12/18/2015  Vision Screening: Recommended annual ophthalmology exams for early detection of glaucoma and other disorders of the eye. Is the patient up to date with their annual eye exam?  Yes  Who is the provider or what is the name of the office in which the patient attends annual eye exams? [facility] If pt is not established with a provider, would they like to be referred to a provider to establish care? No .   Dental Screening: Recommended annual dental exams for proper oral hygiene  Community Resource Referral / Chronic Care Management: CRR required this visit?  No   CCM required this visit?  No      Plan:     I have personally reviewed and noted the following in the patient's chart:   Medical and social history Use of alcohol, tobacco or illicit drugs  Current medications and supplements including opioid prescriptions. Patient is not currently taking opioid prescriptions. Functional ability and status Nutritional status Physical activity Advanced directives List of other physicians Hospitalizations, surgeries, and ER visits in previous 12 months Vitals Screenings to include cognitive, depression, and falls Referrals and appointments  In addition, I have reviewed and discussed with patient certain preventive protocols, quality metrics, and best practice  recommendations. A written personalized care plan for preventive services as well as general preventive health recommendations were provided to patient.     Sandrea Hammond, LPN   [id number]/[id number]   Nurse Notes: None

## 2020-12-13 ENCOUNTER — Other Ambulatory Visit: Payer: Self-pay

## 2020-12-13 ENCOUNTER — Ambulatory Visit (AMBULATORY_SURGERY_CENTER): Payer: Medicare HMO | Admitting: *Deleted

## 2020-12-13 ENCOUNTER — Encounter: Payer: Self-pay | Admitting: Internal Medicine

## 2020-12-13 VITALS — Ht 71.0 in | Wt 202.0 lb

## 2020-12-13 DIAGNOSIS — Z86010 Personal history of colonic polyps: Secondary | ICD-10-CM

## 2020-12-13 DIAGNOSIS — Z800 Family history of malignant neoplasm of digestive organs: Secondary | ICD-10-CM

## 2020-12-13 MED ORDER — PEG 3350-KCL-NA BICARB-NACL 420 G PO SOLR: 4000.0000 mL | Freq: Once | ORAL | 0 refills | Status: AC

## 2020-12-13 NOTE — Progress Notes (Signed)

## 2020-12-25 ENCOUNTER — Other Ambulatory Visit: Payer: Self-pay | Admitting: Family Medicine

## 2020-12-25 DIAGNOSIS — E559 Vitamin D deficiency, unspecified: Secondary | ICD-10-CM

## 2020-12-27 ENCOUNTER — Encounter: Payer: Self-pay | Admitting: Internal Medicine

## 2020-12-27 ENCOUNTER — Ambulatory Visit (AMBULATORY_SURGERY_CENTER): Payer: Medicare HMO | Admitting: Internal Medicine

## 2020-12-27 ENCOUNTER — Other Ambulatory Visit: Payer: Self-pay

## 2020-12-27 VITALS — BP 121/50 | HR 65 | Temp 98.0°F | Resp 16 | Ht 71.0 in | Wt 202.0 lb

## 2020-12-27 DIAGNOSIS — D120 Benign neoplasm of cecum: Secondary | ICD-10-CM | POA: Diagnosis not present

## 2020-12-27 DIAGNOSIS — D125 Benign neoplasm of sigmoid colon: Secondary | ICD-10-CM | POA: Diagnosis not present

## 2020-12-27 DIAGNOSIS — E669 Obesity, unspecified: Secondary | ICD-10-CM | POA: Diagnosis not present

## 2020-12-27 DIAGNOSIS — D123 Benign neoplasm of transverse colon: Secondary | ICD-10-CM | POA: Diagnosis not present

## 2020-12-27 DIAGNOSIS — Z86010 Personal history of colonic polyps: Secondary | ICD-10-CM | POA: Diagnosis not present

## 2020-12-27 DIAGNOSIS — E785 Hyperlipidemia, unspecified: Secondary | ICD-10-CM | POA: Diagnosis not present

## 2020-12-27 MED ORDER — SODIUM CHLORIDE 0.9 % IV SOLN: 500.0000 mL | Freq: Once | INTRAVENOUS | Status: DC

## 2020-12-27 NOTE — Op Note (Signed)
[place] Patient Name: Austin Clark Procedure Date: 12/27/2020 8:40 AM MRN: [id number] Endoscopist: Jerene Bears , MD Age: 70 Referring MD:  Date of Birth: 05-Dec-1950 Gender: Male Account #: [id number] Procedure:                Colonoscopy Indications:              Screening in patient at increased risk: Family                            history of 1st-degree relative with colorectal                            cancer, Last colonoscopy: 2017 (hyperplastic                            sigmoid polyp only) Medicines:                Monitored Anesthesia Care Procedure:                Pre-Anesthesia Assessment:                           - Prior to the procedure, a History and Physical                            was performed, and patient medications and                            allergies were reviewed. The patient's tolerance of                            previous anesthesia was also reviewed. The risks                            and benefits of the procedure and the sedation                            options and risks were discussed with the patient.                            All questions were answered, and informed consent                            was obtained. Prior Anticoagulants: The patient has                            taken no previous anticoagulant or antiplatelet                            agents. ASA Grade Assessment: II - A patient with                            mild systemic disease. After reviewing the risks  and benefits, the patient was deemed in                            satisfactory condition to undergo the procedure.                           After obtaining informed consent, the colonoscope                            was passed under direct vision. Throughout the                            procedure, the patient's blood pressure, pulse, and                            oxygen saturations were monitored continuously. The                             CF [id number] #[id number] was introduced through the anus                            and advanced to the cecum, identified by                            appendiceal orifice and ileocecal valve. The                            colonoscopy was performed without difficulty. The                            patient tolerated the procedure well. The quality                            of the bowel preparation was good. The ileocecal                            valve, appendiceal orifice, and rectum were                            photographed. Scope In: 7:82:42 AM Scope Out: 9:06:30 AM Scope Withdrawal Time: 0 hours 14 minutes 57 seconds  Total Procedure Duration: 0 hours 19 minutes 41 seconds  Findings:                 The digital rectal exam was normal.                           Two sessile polyps were found in the cecum. The                            polyps were 3 to 6 mm in size. These polyps were                            removed with a cold snare. Resection and retrieval  were complete.                           Two sessile polyps were found in the transverse                            colon. The polyps were 4 to 7 mm in size. These                            polyps were removed with a cold snare. Resection                            and retrieval were complete.                           Two sessile polyps were found in the sigmoid colon.                            The polyps were 3 to 8 mm in size. These polyps                            were removed with a cold snare. Resection and                            retrieval were complete.                           There was a medium-sized lipoma, 15 mm in diameter,                            in the transverse colon.                           Internal hemorrhoids were found during                            retroflexion. The hemorrhoids were small. Complications:            No immediate  complications. Estimated Blood Loss:     Estimated blood loss was minimal. Impression:               - Two 3 to 6 mm polyps in the cecum, removed with a                            cold snare. Resected and retrieved.                           - Two 4 to 7 mm polyps in the transverse colon,                            removed with a cold snare. Resected and retrieved.                           - Two 3 to 8 mm polyps  in the sigmoid colon,                            removed with a cold snare. Resected and retrieved.                           - Medium-sized lipoma in the transverse colon.                           - Internal hemorrhoids. Recommendation:           - Patient has a contact number available for                            emergencies. The signs and symptoms of potential                            delayed complications were discussed with the                            patient. Return to normal activities tomorrow.                            Written discharge instructions were provided to the                            patient.                           - Resume previous diet.                           - Continue present medications.                           - Await pathology results.                           - Repeat colonoscopy is recommended for                            surveillance. The colonoscopy date will be                            determined after pathology results from today's                            exam become available for review. Jerene Bears, MD 12/27/2020 9:19:49 AM This report has been signed electronically.

## 2020-12-27 NOTE — Patient Instructions (Signed)
Read all of the handouts given to you by your recovery room nurse. ? ?YOU HAD AN ENDOSCOPIC PROCEDURE TODAY AT THE [facility]:   Refer to the procedure report that was given to you for any specific questions about what was found during the examination.  If the procedure report does not answer your questions, please call your gastroenterologist to clarify.  If you requested that your care partner not be given the details of your procedure findings, then the procedure report has been included in a sealed envelope for you to review at your convenience later. ? ?YOU SHOULD EXPECT: Some feelings of bloating in the abdomen. Passage of more gas than usual.  Walking can help get rid of the air that was put into your GI tract during the procedure and reduce the bloating. If you had a lower endoscopy (such as a colonoscopy or flexible sigmoidoscopy) you may notice spotting of blood in your stool or on the toilet paper. If you underwent a bowel prep for your procedure, you may not have a normal bowel movement for a few days. ? ?Please Note:  You might notice some irritation and congestion in your nose or some drainage.  This is from the oxygen used during your procedure.  There is no need for concern and it should clear up in a day or so. ? ?SYMPTOMS TO REPORT IMMEDIATELY: ? ?Following lower endoscopy (colonoscopy or flexible sigmoidoscopy): ? Excessive amounts of blood in the stool ? Significant tenderness or worsening of abdominal pains ? Swelling of the abdomen that is new, acute ? Fever of 100?F or higher ? ?  ?For urgent or emergent issues, a gastroenterologist can be reached at any hour by calling [phone]. ?Do not use MyChart messaging for urgent concerns.  ? ? ?DIET:  We do recommend a small meal at first, but then you may proceed to your regular diet.  Drink plenty of fluids but you should avoid alcoholic beverages for 24 hours. ? ?ACTIVITY:  You should plan to take it easy for the rest of today  and you should NOT DRIVE or use heavy machinery until tomorrow (because of the sedation medicines used during the test).   ? ?FOLLOW UP: ?Our staff will call the number listed on your records 48-72 hours following your procedure to check on you and address any questions or concerns that you may have regarding the information given to you following your procedure. If we do not reach you, we will leave a message.  We will attempt to reach you two times.  During this call, we will ask if you have developed any symptoms of COVID 19. If you develop any symptoms (ie: fever, flu-like symptoms, shortness of breath, cough etc.) before then, please call [phone].  If you test positive for Covid 19 in the 2 weeks post procedure, please call and report this information to us.   ? ?If any biopsies were taken you will be contacted by phone or by letter within the next 1-3 weeks.  Please call us at [phone] if you have not heard about the biopsies in 3 weeks.  ? ? ?SIGNATURES/CONFIDENTIALITY: ?You and/or your care partner have signed paperwork which will be entered into your electronic medical record.  These signatures attest to the fact that that the information above on your After Visit Summary has been reviewed and is understood.  Full responsibility of the confidentiality of this discharge information lies with you and/or your care-partner.  ?

## 2020-12-27 NOTE — Progress Notes (Signed)
Called to room to assist during endoscopic procedure.  Patient ID and intended procedure confirmed with present staff. Received instructions for my participation in the procedure from the performing physician.  

## 2020-12-27 NOTE — Progress Notes (Signed)
Pt's states no medical or surgical changes since previsit or office visit. 

## 2020-12-27 NOTE — Progress Notes (Signed)
Report to PACU, RN, vss, BBS= Clear.  

## 2020-12-27 NOTE — Progress Notes (Signed)
GASTROENTEROLOGY PROCEDURE H&P NOTE   Primary Care Physician: Dettinger, Fransisca Kaufmann, MD    Reason for Procedure:  family history of colon cancer  Plan:    Colonoscopy  Patient is appropriate for endoscopic procedure(s) in the ambulatory ([facility]) setting.  The nature of the procedure, as well as the risks, benefits, and alternatives were carefully and thoroughly reviewed with the patient. Ample time for discussion and questions allowed. The patient understood, was satisfied, and agreed to proceed.     HPI: Austin Clark is a 70 y.o. male who presents for colonoscopy.  Medical history as below.  No recent chest pain or shortness of breath.  Tolerated the prep.  Last colonoscopy was in 2017 at [facility].  This included a 6 mm hyperplastic polyp removed from the sigmoid.  Past Medical History:  Diagnosis Date   BPH (benign prostatic hypertrophy)    Colon polyps    Cough    Glaucoma 1993   Hyperlipidemia    Hypothyroid    Knee pain    Pulmonary nodule    Tobacco use     Past Surgical History:  Procedure Laterality Date   COLONOSCOPY  12/20/2015   Dr.Merik Mignano   EYE SURGERY Bilateral    cataract   knee surgery Right    "cleaned"   LASER SURGERY     GLAUCOMA    TONSILLECTOMY      Prior to Admission medications   Medication Sig Start Date End Date Taking? Authorizing Provider  dorzolamide-timolol (COSOPT) 22.3-6.8 MG/ML ophthalmic solution Place 1 drop into the left eye.  01/26/17  Yes [provider]  Latanoprostene Bunod (VYZULTA) 0.024 % SOLN Apply to eye.   Yes [provider]  Omega-3 1000 MG CAPS Take 2 capsules (2,000 mg total) by mouth in the morning and at bedtime. 04/03/20  Yes Dettinger, Fransisca Kaufmann, MD  rosuvastatin (CRESTOR) 40 MG tablet Take 1 tablet (40 mg total) by mouth daily. 09/23/20  Yes Dettinger, Fransisca Kaufmann, MD  Vitamin D, Ergocalciferol, (DRISDOL) 1.25 MG (50000 UNIT) CAPS capsule TAKE 1 CAPSULE (50,000 UNITS TOTAL) BY MOUTH EVERY 7 (SEVEN) DAYS.  12/25/20   Dettinger, Fransisca Kaufmann, MD    Current Outpatient Medications  Medication Sig Dispense Refill   dorzolamide-timolol (COSOPT) 22.3-6.8 MG/ML ophthalmic solution Place 1 drop into the left eye.   11   Latanoprostene Bunod (VYZULTA) 0.024 % SOLN Apply to eye.     Omega-3 1000 MG CAPS Take 2 capsules (2,000 mg total) by mouth in the morning and at bedtime. 360 capsule 3   rosuvastatin (CRESTOR) 40 MG tablet Take 1 tablet (40 mg total) by mouth daily. 90 tablet 3   Vitamin D, Ergocalciferol, (DRISDOL) 1.25 MG (50000 UNIT) CAPS capsule TAKE 1 CAPSULE (50,000 UNITS TOTAL) BY MOUTH EVERY 7 (SEVEN) DAYS. 12 capsule 1   Current Facility-Administered Medications  Medication Dose Route Frequency Provider Last Rate Last Admin   0.9 %  sodium chloride infusion  500 mL Intravenous Once Lavone Barrientes, Lajuan Lines, MD        Allergies as of 12/27/2020 - Review Complete 12/27/2020  Allergen Reaction Noted   Penicillins Rash 11/02/2012    Family History  Problem Relation Age of Onset   Heart disease Mother    Colon cancer Father        mid 62's   Cancer Father        colon cancer   Colon polyps Neg Hx    Esophageal cancer Neg Hx    Rectal  cancer Neg Hx    Stomach cancer Neg Hx     Social History   Socioeconomic History   Marital status: Married    Spouse name: [name]   Number of children: 2   Years of education: 12   Highest education level: High school graduate  Occupational History   Occupation: fulltime    Employer: [employer]   Occupation: Retired  Tobacco Use   Smoking status: Former    Packs/day: 1.00    Types: Cigarettes    Start date: 02/17/1967    Quit date: 02/16/1997    Years since quitting: 23.8   Smokeless tobacco: Never   Tobacco comments:    Rare cigarettes  Vaping Use   Vaping Use: Never used  Substance and Sexual Activity   Alcohol use: Not Currently    Comment: previous   Drug use: No   Sexual activity: Yes    Birth control/protection: None  Other  Topics Concern   Not on file  Social History Narrative   Lives in 2 story home with wife   Social Determinants of Health   Financial Resource Strain: Low Risk    Difficulty of Paying Living Expenses: Not hard at all  Food Insecurity: No Food Insecurity   Worried About Charity fundraiser in the Last Year: Never true   Arboriculturist in the Last Year: Never true  Transportation Needs: No Transportation Needs   Lack of Transportation (Medical): No   Lack of Transportation (Non-Medical): No  Physical Activity: Insufficiently Active   Days of Exercise per Week: 2 days   Minutes of Exercise per Session: 60 min  Stress: No Stress Concern Present   Feeling of Stress : Not at all  Social Connections: Socially Integrated   Frequency of Communication with Friends and Family: Twice a week   Frequency of Social Gatherings with Friends and Family: Twice a week   Attends Religious Services: More than 4 times per year   Active Member of [employer] or Organizations: Yes   Attends Music therapist: More than 4 times per year   Marital Status: Married  Human resources officer Violence: Not At Risk   Fear of Current or Ex-Partner: No   Emotionally Abused: No   Physically Abused: No   Sexually Abused: No    Physical Exam: Vital signs in last 24 hours: @BP  111/62   Pulse (!) 55   Temp 98 F (36.7 C)   Ht 5\' 11"  (1.803 m)   Wt 202 lb (91.6 kg)   SpO2 99%   BMI 28.17 kg/m  GEN: NAD EYE: Sclerae anicteric ENT: MMM CV: Non-tachycardic Pulm: CTA b/l GI: Soft, NT/ND NEURO:  Alert & Oriented x 3   Zenovia Jarred, MD [facility]  12/27/2020 8:35 AM

## 2020-12-31 ENCOUNTER — Telehealth: Payer: Self-pay | Admitting: *Deleted

## 2020-12-31 NOTE — Telephone Encounter (Signed)
  Follow up Call-  Call back number 12/27/2020  Post procedure Call Back phone  # [phone]  Permission to leave phone message Yes  Some recent data might be hidden     Patient questions:  Do you have a fever, pain , or abdominal swelling? No. Pain Score  0 *  Have you tolerated food without any problems? Yes.    Have you been able to return to your normal activities? Yes.    Do you have any questions about your discharge instructions: Diet   No. Medications  No. Follow up visit  No.  Do you have questions or concerns about your Care? No.  Actions: * If pain score is 4 or above: No action needed, pain <4.

## 2021-01-01 ENCOUNTER — Encounter: Payer: Self-pay | Admitting: Internal Medicine

## 2021-03-11 DIAGNOSIS — Z961 Presence of intraocular lens: Secondary | ICD-10-CM | POA: Diagnosis not present

## 2021-03-11 DIAGNOSIS — Z9889 Other specified postprocedural states: Secondary | ICD-10-CM | POA: Diagnosis not present

## 2021-03-11 DIAGNOSIS — H401133 Primary open-angle glaucoma, bilateral, severe stage: Secondary | ICD-10-CM | POA: Diagnosis not present

## 2021-04-09 ENCOUNTER — Other Ambulatory Visit: Payer: Self-pay | Admitting: Family Medicine

## 2021-04-16 ENCOUNTER — Other Ambulatory Visit: Payer: Self-pay | Admitting: Family Medicine

## 2021-05-08 ENCOUNTER — Other Ambulatory Visit: Payer: Self-pay | Admitting: Family Medicine

## 2021-05-08 NOTE — Telephone Encounter (Signed)
OV 09/23/20 RTC 1 yr Next OV 10/01/21 ?

## 2021-05-09 MED ORDER — OMEGA III EPA+DHA 1000 MG PO CAPS: 2000.0000 mg | ORAL_CAPSULE | Freq: Two times a day (BID) | ORAL | 5 refills | Status: DC

## 2021-05-09 NOTE — Addendum Note (Signed)
Addended by: Antonietta Barcelona D on: 05/09/2021 02:50 PM ? ? Modules accepted: Orders ? ?

## 2021-05-09 NOTE — Telephone Encounter (Signed)
Refill failed. resent °

## 2021-06-02 ENCOUNTER — Telehealth: Payer: Self-pay | Admitting: Family Medicine

## 2021-06-02 MED ORDER — OMEGA III EPA+DHA 1000 MG PO CAPS: 2000.0000 mg | ORAL_CAPSULE | Freq: Two times a day (BID) | ORAL | 5 refills | Status: DC

## 2021-06-02 NOTE — Telephone Encounter (Signed)
[facility] to [facility] [place]. ? ?[facility] who confirmed that [facility] never received the Rx. ?

## 2021-06-02 NOTE — Telephone Encounter (Signed)
Refills resent.

## 2021-06-12 ENCOUNTER — Other Ambulatory Visit: Payer: Self-pay | Admitting: Family Medicine

## 2021-06-12 DIAGNOSIS — E559 Vitamin D deficiency, unspecified: Secondary | ICD-10-CM

## 2021-07-09 DIAGNOSIS — H401133 Primary open-angle glaucoma, bilateral, severe stage: Secondary | ICD-10-CM | POA: Diagnosis not present

## 2021-07-09 DIAGNOSIS — D492 Neoplasm of unspecified behavior of bone, soft tissue, and skin: Secondary | ICD-10-CM | POA: Diagnosis not present

## 2021-07-09 DIAGNOSIS — Z961 Presence of intraocular lens: Secondary | ICD-10-CM | POA: Diagnosis not present

## 2021-07-30 DIAGNOSIS — L820 Inflamed seborrheic keratosis: Secondary | ICD-10-CM | POA: Diagnosis not present

## 2021-07-30 DIAGNOSIS — H02825 Cysts of left lower eyelid: Secondary | ICD-10-CM | POA: Diagnosis not present

## 2021-07-30 DIAGNOSIS — D23122 Other benign neoplasm of skin of left lower eyelid, including canthus: Secondary | ICD-10-CM | POA: Diagnosis not present

## 2021-09-09 ENCOUNTER — Other Ambulatory Visit: Payer: Self-pay | Admitting: Family Medicine

## 2021-09-09 DIAGNOSIS — E559 Vitamin D deficiency, unspecified: Secondary | ICD-10-CM

## 2021-09-29 ENCOUNTER — Other Ambulatory Visit: Payer: No Typology Code available for payment source

## 2021-09-29 DIAGNOSIS — Z0000 Encounter for general adult medical examination without abnormal findings: Secondary | ICD-10-CM | POA: Diagnosis not present

## 2021-09-29 DIAGNOSIS — N400 Enlarged prostate without lower urinary tract symptoms: Secondary | ICD-10-CM | POA: Diagnosis not present

## 2021-09-29 DIAGNOSIS — Z125 Encounter for screening for malignant neoplasm of prostate: Secondary | ICD-10-CM | POA: Diagnosis not present

## 2021-09-29 DIAGNOSIS — E785 Hyperlipidemia, unspecified: Secondary | ICD-10-CM | POA: Diagnosis not present

## 2021-09-29 DIAGNOSIS — E559 Vitamin D deficiency, unspecified: Secondary | ICD-10-CM | POA: Diagnosis not present

## 2021-09-30 LAB — LIPID PANEL
Chol/HDL Ratio: 3.1 ratio (ref 0.0–5.0)
Cholesterol, Total: 127 mg/dL (ref 100–199)
HDL: 41 mg/dL (ref >39)
LDL Chol Calc (NIH): 68 mg/dL (ref 0–99)
Triglycerides: 92 mg/dL (ref 0–149)
VLDL Cholesterol Cal: 18 mg/dL (ref 5–40)

## 2021-09-30 LAB — CBC WITH DIFFERENTIAL/PLATELET
Basophils Absolute: 0 10*3/uL (ref 0.0–0.2)
Basos: 1 %
EOS (ABSOLUTE): 0.2 10*3/uL (ref 0.0–0.4)
Eos: 3 %
Hematocrit: 46.3 % (ref 37.5–51.0)
Hemoglobin: 15.1 g/dL (ref 13.0–17.7)
Immature Grans (Abs): 0 10*3/uL (ref 0.0–0.1)
Immature Granulocytes: 0 %
Lymphocytes Absolute: 1.8 10*3/uL (ref 0.7–3.1)
Lymphs: 35 %
MCH: 33.1 pg — ABNORMAL HIGH (ref 26.6–33.0)
MCHC: 32.6 g/dL (ref 31.5–35.7)
MCV: 102 fL — ABNORMAL HIGH (ref 79–97)
Monocytes Absolute: 0.4 10*3/uL (ref 0.1–0.9)
Monocytes: 7 %
Neutrophils Absolute: 2.8 10*3/uL (ref 1.4–7.0)
Neutrophils: 54 %
Platelets: 143 10*3/uL — ABNORMAL LOW (ref 150–450)
RBC: 4.56 x10E6/uL (ref 4.14–5.80)
RDW: 12.6 % (ref 11.6–15.4)
WBC: 5.2 10*3/uL (ref 3.4–10.8)

## 2021-09-30 LAB — TSH: TSH: 4.04 u[IU]/mL (ref 0.450–4.500)

## 2021-09-30 LAB — CMP14+EGFR
ALT: 18 IU/L (ref 0–44)
AST: 21 IU/L (ref 0–40)
Albumin/Globulin Ratio: 2.3 — ABNORMAL HIGH (ref 1.2–2.2)
Albumin: 4.6 g/dL (ref 3.9–4.9)
Alkaline Phosphatase: 65 IU/L (ref 44–121)
BUN/Creatinine Ratio: 9 — ABNORMAL LOW (ref 10–24)
BUN: 9 mg/dL (ref 8–27)
Bilirubin Total: 0.5 mg/dL (ref 0.0–1.2)
CO2: 26 mmol/L (ref 20–29)
Calcium: 9.7 mg/dL (ref 8.6–10.2)
Chloride: 106 mmol/L (ref 96–106)
Creatinine, Ser: 1.02 mg/dL (ref 0.76–1.27)
Globulin, Total: 2 g/dL (ref 1.5–4.5)
Glucose: 105 mg/dL — ABNORMAL HIGH (ref 70–99)
Potassium: 5.1 mmol/L (ref 3.5–5.2)
Sodium: 144 mmol/L (ref 134–144)
Total Protein: 6.6 g/dL (ref 6.0–8.5)
eGFR: 79 mL/min/{1.73_m2} (ref >59)

## 2021-09-30 LAB — PSA, TOTAL AND FREE
PSA, Free Pct: 25.7 %
PSA, Free: 0.59 ng/mL
Prostate Specific Ag, Serum: 2.3 ng/mL (ref 0.0–4.0)

## 2021-09-30 LAB — VITAMIN D 25 HYDROXY (VIT D DEFICIENCY, FRACTURES): Vit D, 25-Hydroxy: 79 ng/mL (ref 30.0–100.0)

## 2021-10-01 ENCOUNTER — Ambulatory Visit (INDEPENDENT_AMBULATORY_CARE_PROVIDER_SITE_OTHER): Payer: No Typology Code available for payment source | Admitting: Family Medicine

## 2021-10-01 ENCOUNTER — Encounter: Payer: Self-pay | Admitting: Family Medicine

## 2021-10-01 VITALS — BP 121/68 | HR 59 | Temp 98.0°F | Ht 71.0 in | Wt 196.0 lb

## 2021-10-01 DIAGNOSIS — Z0000 Encounter for general adult medical examination without abnormal findings: Secondary | ICD-10-CM

## 2021-10-01 DIAGNOSIS — E559 Vitamin D deficiency, unspecified: Secondary | ICD-10-CM | POA: Diagnosis not present

## 2021-10-01 DIAGNOSIS — E7849 Other hyperlipidemia: Secondary | ICD-10-CM

## 2021-10-01 DIAGNOSIS — Z0001 Encounter for general adult medical examination with abnormal findings: Secondary | ICD-10-CM

## 2021-10-01 DIAGNOSIS — B351 Tinea unguium: Secondary | ICD-10-CM | POA: Diagnosis not present

## 2021-10-01 MED ORDER — ROSUVASTATIN CALCIUM 40 MG PO TABS: 40.0000 mg | ORAL_TABLET | Freq: Every day | ORAL | 3 refills | Status: DC

## 2021-10-01 MED ORDER — TERBINAFINE HCL 250 MG PO TABS: 250.0000 mg | ORAL_TABLET | Freq: Every day | ORAL | 1 refills | Status: DC

## 2021-10-01 MED ORDER — VITAMIN D (ERGOCALCIFEROL) 1.25 MG (50000 UNIT) PO CAPS: 50000.0000 [IU] | ORAL_CAPSULE | ORAL | 3 refills | Status: DC

## 2021-10-01 MED ORDER — OMEGA III EPA+DHA 1000 MG PO CAPS: 2000.0000 mg | ORAL_CAPSULE | Freq: Two times a day (BID) | ORAL | 5 refills | Status: DC

## 2021-10-01 NOTE — Progress Notes (Signed)
BP 121/68   Pulse (!) 59   Temp 98 F (36.7 C)   Ht $R'5\' 11"'QH$  (1.803 m)   Wt 196 lb (88.9 kg)   SpO2 98%   BMI 27.34 kg/m    Subjective:   Patient ID: Austin Clark, male    DOB: Feb 01, 1951, 71 y.o.   MRN: [id number]  HPI: Austin Clark is a 71 y.o. male presenting on 10/01/2021 for Medical Management of Chronic Issues (CPE)   HPI Adult well exam and physical Patient denies any chest pain, shortness of breath, headaches or vision issues, abdominal complaints, diarrhea, nausea, vomiting, or joint issues.   Hyperlipidemia Patient is coming in for recheck of his hyperlipidemia. The patient is currently taking crestor. They deny any issues with myalgias or history of liver damage from it. They deny any focal numbness or weakness or chest pain.   BPH Patient is coming in for recheck on BPH Symptoms: None currently Medication: None currently Last PSA: This last week, had gone up to 2.3  Patient is complaining of thickened and yellow toenails that are worsening and has tried multiple over-the-counter antifungals and cannot seem to get better  Relevant past medical, surgical, family and social history reviewed and updated as indicated. Interim medical history since our last visit reviewed. Allergies and medications reviewed and updated.  Review of Systems  Constitutional:  Negative for chills and fever.  HENT:  Negative for ear pain and tinnitus.   Eyes:  Negative for pain.  Respiratory:  Negative for cough, shortness of breath and wheezing.   Cardiovascular:  Negative for chest pain, palpitations and leg swelling.  Gastrointestinal:  Negative for abdominal pain, blood in stool, constipation and diarrhea.  Genitourinary:  Negative for dysuria and hematuria.  Musculoskeletal:  Negative for back pain and myalgias.  Skin:  Positive for color change. Negative for rash.  Neurological:  Negative for dizziness, weakness and headaches.  Psychiatric/Behavioral:  Negative for suicidal ideas.    All other systems reviewed and are negative.   Per HPI unless specifically indicated above   Allergies as of 10/01/2021       Reactions   Penicillins Rash        Medication List        Accurate as of October 01, 2021 10:23 AM. If you have any questions, ask your nurse or doctor.          dorzolamide-timolol 22.3-6.8 MG/ML ophthalmic solution Commonly known as: COSOPT Place 1 drop into the left eye.   Omega III EPA+DHA 1000 MG Caps Take 2 capsules (2,000 mg total) by mouth in the morning and at bedtime.   rosuvastatin 40 MG tablet Commonly known as: CRESTOR Take 1 tablet (40 mg total) by mouth daily.   terbinafine 250 MG tablet Commonly known as: LAMISIL Take 1 tablet (250 mg total) by mouth daily. Started by: Worthy Rancher, MD   Vitamin D (Ergocalciferol) 1.25 MG (50000 UNIT) Caps capsule Commonly known as: DRISDOL Take 1 capsule (50,000 Units total) by mouth every 7 (seven) days.   Vyzulta 0.024 % Soln Generic drug: Latanoprostene Bunod Apply to eye.         Objective:   BP 121/68   Pulse (!) 59   Temp 98 F (36.7 C)   Ht $R'5\' 11"'YZ$  (1.803 m)   Wt 196 lb (88.9 kg)   SpO2 98%   BMI 27.34 kg/m   Wt Readings from Last 3 Encounters:  10/01/21 196 lb (88.9 kg)  12/27/20 202 lb (  91.6 kg)  12/13/20 202 lb (91.6 kg)    Physical Exam Vitals and nursing note reviewed.  Constitutional:      General: He is not in acute distress.    Appearance: He is well-developed. He is not diaphoretic.  Eyes:     General: No scleral icterus.    Conjunctiva/sclera: Conjunctivae normal.  Neck:     Thyroid: No thyromegaly.  Cardiovascular:     Rate and Rhythm: Normal rate and regular rhythm.     Heart sounds: Normal heart sounds. No murmur heard. Pulmonary:     Effort: Pulmonary effort is normal. No respiratory distress.     Breath sounds: Normal breath sounds. No wheezing.  Musculoskeletal:        General: Normal range of motion.     Cervical back: Neck  supple.  Lymphadenopathy:     Cervical: No cervical adenopathy.  Skin:    General: Skin is warm and dry.     Findings: Lesion (Thickened and yellow toenails on the first 3 digits of both feet) present. No rash.  Neurological:     Mental Status: He is alert and oriented to person, place, and time.     Coordination: Coordination normal.  Psychiatric:        Behavior: Behavior normal.     Results for orders placed or performed in visit on 09/29/21  CBC with Differential/Platelet  Result Value Ref Range   WBC 5.2 3.4 - 10.8 x10E3/uL   RBC 4.56 4.14 - 5.80 x10E6/uL   Hemoglobin 15.1 13.0 - 17.7 g/dL   Hematocrit 03.8 88.2 - 51.0 %   MCV 102 (H) 79 - 97 fL   MCH 33.1 (H) 26.6 - 33.0 pg   MCHC 32.6 31.5 - 35.7 g/dL   RDW 80.0 34.9 - 17.9 %   Platelets 143 (L) 150 - 450 x10E3/uL   Neutrophils 54 Not Estab. %   Lymphs 35 Not Estab. %   Monocytes 7 Not Estab. %   Eos 3 Not Estab. %   Basos 1 Not Estab. %   Neutrophils Absolute 2.8 1.4 - 7.0 x10E3/uL   Lymphocytes Absolute 1.8 0.7 - 3.1 x10E3/uL   Monocytes Absolute 0.4 0.1 - 0.9 x10E3/uL   EOS (ABSOLUTE) 0.2 0.0 - 0.4 x10E3/uL   Basophils Absolute 0.0 0.0 - 0.2 x10E3/uL   Immature Granulocytes 0 Not Estab. %   Immature Grans (Abs) 0.0 0.0 - 0.1 x10E3/uL  CMP14+EGFR  Result Value Ref Range   Glucose 105 (H) 70 - 99 mg/dL   BUN 9 8 - 27 mg/dL   Creatinine, Ser 1.50 0.76 - 1.27 mg/dL   eGFR 79 >56 PV/XYI/0.16   BUN/Creatinine Ratio 9 (L) 10 - 24   Sodium 144 134 - 144 mmol/L   Potassium 5.1 3.5 - 5.2 mmol/L   Chloride 106 96 - 106 mmol/L   CO2 26 20 - 29 mmol/L   Calcium 9.7 8.6 - 10.2 mg/dL   Total Protein 6.6 6.0 - 8.5 g/dL   Albumin 4.6 3.9 - 4.9 g/dL   Globulin, Total 2.0 1.5 - 4.5 g/dL   Albumin/Globulin Ratio 2.3 (H) 1.2 - 2.2   Bilirubin Total 0.5 0.0 - 1.2 mg/dL   Alkaline Phosphatase 65 44 - 121 IU/L   AST 21 0 - 40 IU/L   ALT 18 0 - 44 IU/L  Lipid panel  Result Value Ref Range   Cholesterol, Total 127 100 -  199 mg/dL   Triglycerides 92 0 - 149 mg/dL  HDL 41 >39 mg/dL   VLDL Cholesterol Cal 18 5 - 40 mg/dL   LDL Chol Calc (NIH) 68 0 - 99 mg/dL   Chol/HDL Ratio 3.1 0.0 - 5.0 ratio  PSA, total and free  Result Value Ref Range   Prostate Specific Ag, Serum 2.3 0.0 - 4.0 ng/mL   PSA, Free 0.59 N/A ng/mL   PSA, Free Pct 25.7 %  VITAMIN D 25 Hydroxy (Vit-D Deficiency, Fractures)  Result Value Ref Range   Vit D, 25-Hydroxy 79.0 30.0 - 100.0 ng/mL  TSH  Result Value Ref Range   TSH 4.040 0.450 - 4.500 uIU/mL    Assessment & Plan:   Problem List Items Addressed This Visit       Other   Hyperlipidemia (Chronic)   Relevant Medications   Omega-3 Fatty Acids (OMEGA III EPA+DHA) 1000 MG CAPS   rosuvastatin (CRESTOR) 40 MG tablet   Vitamin D deficiency   Relevant Medications   Vitamin D, Ergocalciferol, (DRISDOL) 1.25 MG (50000 UNIT) CAPS capsule   Other Visit Diagnoses     Well adult exam    -  Primary   Onychomycosis       Relevant Medications   terbinafine (LAMISIL) 250 MG tablet       We will do terbinafine for his toenails for 6 months and then he can come back if not cured by then.  Continue current medicine.  Seems to be doing well.  Blood work looks good for the most part...  PSA was slightly up and we will recheck it in the future, down from 0.8-2.3. Follow up plan: Return in about 1 year (around 10/02/2022), or if symptoms worsen or fail to improve, for Physical.  Counseling provided for all of the vaccine components No orders of the defined types were placed in this encounter.   Caryl Pina, MD [facility] 10/01/2021, 10:23 AM

## 2021-11-24 DIAGNOSIS — H401133 Primary open-angle glaucoma, bilateral, severe stage: Secondary | ICD-10-CM | POA: Diagnosis not present

## 2021-11-24 DIAGNOSIS — Z961 Presence of intraocular lens: Secondary | ICD-10-CM | POA: Diagnosis not present

## 2021-11-24 DIAGNOSIS — D492 Neoplasm of unspecified behavior of bone, soft tissue, and skin: Secondary | ICD-10-CM | POA: Diagnosis not present

## 2021-12-25 DIAGNOSIS — H903 Sensorineural hearing loss, bilateral: Secondary | ICD-10-CM | POA: Diagnosis not present

## 2022-01-06 DIAGNOSIS — H903 Sensorineural hearing loss, bilateral: Secondary | ICD-10-CM | POA: Diagnosis not present

## 2022-01-10 ENCOUNTER — Other Ambulatory Visit: Payer: Self-pay | Admitting: Family Medicine

## 2022-01-10 DIAGNOSIS — E559 Vitamin D deficiency, unspecified: Secondary | ICD-10-CM

## 2022-01-13 ENCOUNTER — Telehealth: Payer: Self-pay

## 2022-01-13 NOTE — Telephone Encounter (Signed)
Faxed [facility] informing that Terbinafine one tab daily has been helpful to patient. DX.  Onychomycosis B35.1  Info on Meosha Castanon's desk

## 2022-01-15 NOTE — Telephone Encounter (Signed)
Terbinafine  HCL  Quantity limit exception approved   10/15/2021-01/13/2023

## 2022-02-02 ENCOUNTER — Telehealth: Payer: Self-pay | Admitting: Family Medicine

## 2022-02-02 NOTE — Telephone Encounter (Signed)
Patient declined the Medicare Wellness Visit with [facility]  Patient stated he explained to nurse in the office that he does not feel it is necessary to complete

## 2022-03-10 ENCOUNTER — Other Ambulatory Visit: Payer: Self-pay | Admitting: *Deleted

## 2022-03-10 DIAGNOSIS — B351 Tinea unguium: Secondary | ICD-10-CM

## 2022-03-11 MED ORDER — TERBINAFINE HCL 250 MG PO TABS: 250.0000 mg | ORAL_TABLET | Freq: Every day | ORAL | 0 refills | Status: DC

## 2022-03-23 ENCOUNTER — Encounter: Payer: Self-pay | Admitting: *Deleted

## 2022-03-24 DIAGNOSIS — H401133 Primary open-angle glaucoma, bilateral, severe stage: Secondary | ICD-10-CM | POA: Diagnosis not present

## 2022-03-24 DIAGNOSIS — D492 Neoplasm of unspecified behavior of bone, soft tissue, and skin: Secondary | ICD-10-CM | POA: Diagnosis not present

## 2022-03-24 DIAGNOSIS — Z961 Presence of intraocular lens: Secondary | ICD-10-CM | POA: Diagnosis not present

## 2022-03-27 DIAGNOSIS — H524 Presbyopia: Secondary | ICD-10-CM | POA: Diagnosis not present

## 2022-03-27 DIAGNOSIS — H52221 Regular astigmatism, right eye: Secondary | ICD-10-CM | POA: Diagnosis not present

## 2022-06-12 ENCOUNTER — Other Ambulatory Visit: Payer: Self-pay | Admitting: Family Medicine

## 2022-06-12 DIAGNOSIS — E559 Vitamin D deficiency, unspecified: Secondary | ICD-10-CM

## 2022-06-12 DIAGNOSIS — B351 Tinea unguium: Secondary | ICD-10-CM

## 2022-06-26 ENCOUNTER — Other Ambulatory Visit: Payer: Self-pay | Admitting: Family Medicine

## 2022-06-26 DIAGNOSIS — B351 Tinea unguium: Secondary | ICD-10-CM

## 2022-07-27 DIAGNOSIS — Z961 Presence of intraocular lens: Secondary | ICD-10-CM | POA: Diagnosis not present

## 2022-07-27 DIAGNOSIS — D492 Neoplasm of unspecified behavior of bone, soft tissue, and skin: Secondary | ICD-10-CM | POA: Diagnosis not present

## 2022-07-27 DIAGNOSIS — H401133 Primary open-angle glaucoma, bilateral, severe stage: Secondary | ICD-10-CM | POA: Diagnosis not present

## 2022-08-08 ENCOUNTER — Other Ambulatory Visit: Payer: Self-pay | Admitting: Family Medicine

## 2022-08-08 DIAGNOSIS — E559 Vitamin D deficiency, unspecified: Secondary | ICD-10-CM

## 2022-09-05 ENCOUNTER — Other Ambulatory Visit: Payer: Self-pay | Admitting: Family Medicine

## 2022-09-05 DIAGNOSIS — B351 Tinea unguium: Secondary | ICD-10-CM

## 2022-10-05 ENCOUNTER — Encounter: Payer: No Typology Code available for payment source | Admitting: Family Medicine

## 2022-10-06 ENCOUNTER — Other Ambulatory Visit: Payer: Self-pay

## 2022-10-06 ENCOUNTER — Other Ambulatory Visit: Payer: No Typology Code available for payment source

## 2022-10-06 DIAGNOSIS — Z0000 Encounter for general adult medical examination without abnormal findings: Secondary | ICD-10-CM

## 2022-10-06 DIAGNOSIS — Z125 Encounter for screening for malignant neoplasm of prostate: Secondary | ICD-10-CM | POA: Diagnosis not present

## 2022-10-06 DIAGNOSIS — E559 Vitamin D deficiency, unspecified: Secondary | ICD-10-CM

## 2022-10-06 DIAGNOSIS — E7849 Other hyperlipidemia: Secondary | ICD-10-CM

## 2022-10-06 DIAGNOSIS — N400 Enlarged prostate without lower urinary tract symptoms: Secondary | ICD-10-CM | POA: Diagnosis not present

## 2022-10-07 LAB — CBC WITH DIFFERENTIAL/PLATELET
Basophils Absolute: 0 10*3/uL (ref 0.0–0.2)
Basos: 1 %
EOS (ABSOLUTE): 0.1 10*3/uL (ref 0.0–0.4)
Eos: 2 %
Hematocrit: 44.6 % (ref 37.5–51.0)
Hemoglobin: 15.1 g/dL (ref 13.0–17.7)
Immature Grans (Abs): 0 10*3/uL (ref 0.0–0.1)
Immature Granulocytes: 0 %
Lymphocytes Absolute: 1.5 10*3/uL (ref 0.7–3.1)
Lymphs: 33 %
MCH: 33.7 pg — ABNORMAL HIGH (ref 26.6–33.0)
MCHC: 33.9 g/dL (ref 31.5–35.7)
MCV: 100 fL — ABNORMAL HIGH (ref 79–97)
Monocytes Absolute: 0.4 10*3/uL (ref 0.1–0.9)
Monocytes: 8 %
Neutrophils Absolute: 2.6 10*3/uL (ref 1.4–7.0)
Neutrophils: 56 %
Platelets: 132 10*3/uL — ABNORMAL LOW (ref 150–450)
RBC: 4.48 x10E6/uL (ref 4.14–5.80)
RDW: 12.8 % (ref 11.6–15.4)
WBC: 4.6 10*3/uL (ref 3.4–10.8)

## 2022-10-07 LAB — VITAMIN D 25 HYDROXY (VIT D DEFICIENCY, FRACTURES): Vit D, 25-Hydroxy: 68.9 ng/mL (ref 30.0–100.0)

## 2022-10-07 LAB — PSA, TOTAL AND FREE
PSA, Free Pct: 22.2 %
PSA, Free: 0.2 ng/mL
Prostate Specific Ag, Serum: 0.9 ng/mL (ref 0.0–4.0)

## 2022-10-07 LAB — CMP14+EGFR
ALT: 22 IU/L (ref 0–44)
AST: 23 IU/L (ref 0–40)
Albumin: 4.5 g/dL (ref 3.8–4.8)
Alkaline Phosphatase: 71 IU/L (ref 44–121)
BUN/Creatinine Ratio: 14 (ref 10–24)
BUN: 15 mg/dL (ref 8–27)
Bilirubin Total: 0.6 mg/dL (ref 0.0–1.2)
CO2: 22 mmol/L (ref 20–29)
Calcium: 10 mg/dL (ref 8.6–10.2)
Chloride: 105 mmol/L (ref 96–106)
Creatinine, Ser: 1.05 mg/dL (ref 0.76–1.27)
Globulin, Total: 2.2 g/dL (ref 1.5–4.5)
Glucose: 102 mg/dL — ABNORMAL HIGH (ref 70–99)
Potassium: 4.5 mmol/L (ref 3.5–5.2)
Sodium: 143 mmol/L (ref 134–144)
Total Protein: 6.7 g/dL (ref 6.0–8.5)
eGFR: 76 mL/min/{1.73_m2} (ref >59)

## 2022-10-07 LAB — LIPID PANEL
Chol/HDL Ratio: 4.1 ratio (ref 0.0–5.0)
Cholesterol, Total: 144 mg/dL (ref 100–199)
HDL: 35 mg/dL — ABNORMAL LOW (ref >39)
LDL Chol Calc (NIH): 85 mg/dL (ref 0–99)
Triglycerides: 135 mg/dL (ref 0–149)
VLDL Cholesterol Cal: 24 mg/dL (ref 5–40)

## 2022-10-07 LAB — TSH: TSH: 3.83 u[IU]/mL (ref 0.450–4.500)

## 2022-10-08 ENCOUNTER — Ambulatory Visit: Payer: No Typology Code available for payment source | Admitting: Family Medicine

## 2022-10-08 ENCOUNTER — Encounter: Payer: Self-pay | Admitting: Family Medicine

## 2022-10-08 VITALS — BP 110/60 | HR 49 | Ht 71.0 in | Wt 200.0 lb

## 2022-10-08 DIAGNOSIS — Z0001 Encounter for general adult medical examination with abnormal findings: Secondary | ICD-10-CM | POA: Diagnosis not present

## 2022-10-08 DIAGNOSIS — Z0000 Encounter for general adult medical examination without abnormal findings: Secondary | ICD-10-CM

## 2022-10-08 DIAGNOSIS — B351 Tinea unguium: Secondary | ICD-10-CM

## 2022-10-08 DIAGNOSIS — E7849 Other hyperlipidemia: Secondary | ICD-10-CM

## 2022-10-08 DIAGNOSIS — N400 Enlarged prostate without lower urinary tract symptoms: Secondary | ICD-10-CM | POA: Diagnosis not present

## 2022-10-08 MED ORDER — ROSUVASTATIN CALCIUM 40 MG PO TABS
40.0000 mg | ORAL_TABLET | Freq: Every day | ORAL | 3 refills | Status: DC
Start: 2022-10-08 — End: 2023-10-12

## 2022-10-08 MED ORDER — TERBINAFINE HCL 250 MG PO TABS
250.0000 mg | ORAL_TABLET | Freq: Every day | ORAL | 1 refills | Status: DC
Start: 2022-10-08 — End: 2023-09-01

## 2022-10-08 NOTE — Progress Notes (Signed)
BP 110/60   Pulse (!) 49   Ht 5\' 11"  (1.803 m)   Wt 200 lb (90.7 kg)   SpO2 99%   BMI 27.89 kg/m    Subjective:   Patient ID: Austin Clark, male    DOB: May 01, 1950, 72 y.o.   MRN: [id number]  HPI: Austin Clark is a 72 y.o. male presenting on 10/08/2022 for Medical Management of Chronic Issues (CPE)   HPI Physical exam Patient denies any chest pain, shortness of breath, headaches or vision issues, abdominal complaints, diarrhea, nausea, vomiting, or joint issues.   Hyperlipidemia Patient is coming in for recheck of his hyperlipidemia. The patient is currently taking Crestor and fish oils. They deny any issues with myalgias or history of liver damage from it. They deny any focal numbness or weakness or chest pain.   Patient takes terbinafine for onychomycosis and it is improving and about two thirds healed, mainly to 2 great toes on both feet.  BPH Patient is coming in for recheck on BPH Symptoms: None currently Medication: None Last PSA: 0.92 days ago  Relevant past medical, surgical, family and social history reviewed and updated as indicated. Interim medical history since our last visit reviewed. Allergies and medications reviewed and updated.  Review of Systems  Constitutional:  Negative for chills and fever.  HENT:  Negative for ear pain and tinnitus.   Eyes:  Negative for pain.  Respiratory:  Negative for cough, shortness of breath and wheezing.   Cardiovascular:  Negative for chest pain, palpitations and leg swelling.  Gastrointestinal:  Negative for abdominal pain, blood in stool, constipation and diarrhea.  Genitourinary:  Negative for dysuria and hematuria.  Musculoskeletal:  Negative for back pain and myalgias.  Skin:  Negative for rash.  Neurological:  Negative for dizziness, weakness and headaches.  Psychiatric/Behavioral:  Negative for dysphoric mood and suicidal ideas. The patient is not nervous/anxious.     Per HPI unless specifically indicated  above   Allergies as of 10/08/2022       Reactions   Penicillins Rash        Medication List        Accurate as of October 08, 2022  8:41 AM. If you have any questions, ask your nurse or doctor.          dorzolamide-timolol 2-0.5 % ophthalmic solution Commonly known as: COSOPT Place 1 drop into the left eye.   Omega III EPA+DHA 1000 MG Caps Take 2 capsules (2,000 mg total) by mouth in the morning and at bedtime.   rosuvastatin 40 MG tablet Commonly known as: CRESTOR Take 1 tablet (40 mg total) by mouth daily.   terbinafine 250 MG tablet Commonly known as: LAMISIL Take 1 tablet (250 mg total) by mouth daily.   Vitamin D (Ergocalciferol) 1.25 MG (50000 UNIT) Caps capsule Commonly known as: DRISDOL TAKE 1 CAPSULE (50,000 UNITS TOTAL) BY MOUTH EVERY 7 (SEVEN) DAYS   Vyzulta 0.024 % Soln Generic drug: Latanoprostene Bunod Apply to eye.         Objective:   BP 110/60   Pulse (!) 49   Ht 5\' 11"  (1.803 m)   Wt 200 lb (90.7 kg)   SpO2 99%   BMI 27.89 kg/m   Wt Readings from Last 3 Encounters:  10/08/22 200 lb (90.7 kg)  10/01/21 196 lb (88.9 kg)  12/27/20 202 lb (91.6 kg)    Physical Exam Vitals reviewed.  Constitutional:      General: He is not in acute distress.  Appearance: He is well-developed. He is not diaphoretic.  HENT:     Right Ear: External ear normal.     Left Ear: External ear normal.     Nose: Nose normal.     Mouth/Throat:     Pharynx: No oropharyngeal exudate.  Eyes:     General: No scleral icterus.       Right eye: No discharge.     Conjunctiva/sclera: Conjunctivae normal.     Pupils: Pupils are equal, round, and reactive to light.  Neck:     Thyroid: No thyromegaly.  Cardiovascular:     Rate and Rhythm: Normal rate and regular rhythm.     Heart sounds: Normal heart sounds. No murmur heard. Pulmonary:     Effort: Pulmonary effort is normal. No respiratory distress.     Breath sounds: Normal breath sounds. No wheezing.   Abdominal:     General: Bowel sounds are normal. There is no distension.     Palpations: Abdomen is soft.     Tenderness: There is no abdominal tenderness. There is no guarding or rebound.  Genitourinary:    Prostate: Enlarged. Not tender and no nodules present.     Rectum: Normal.  Musculoskeletal:        General: Normal range of motion.     Cervical back: Neck supple.  Lymphadenopathy:     Cervical: No cervical adenopathy.  Skin:    General: Skin is warm and dry.     Findings: No rash.  Neurological:     Mental Status: He is alert and oriented to person, place, and time.     Coordination: Coordination normal.  Psychiatric:        Behavior: Behavior normal.     Results for orders placed or performed in visit on 10/06/22  PSA, total and free  Result Value Ref Range   Prostate Specific Ag, Serum 0.9 0.0 - 4.0 ng/mL   PSA, Free 0.20 N/A ng/mL   PSA, Free Pct 22.2 %  CBC with Differential/Platelet  Result Value Ref Range   WBC 4.6 3.4 - 10.8 x10E3/uL   RBC 4.48 4.14 - 5.80 x10E6/uL   Hemoglobin 15.1 13.0 - 17.7 g/dL   Hematocrit 40.9 81.1 - 51.0 %   MCV 100 (H) 79 - 97 fL   MCH 33.7 (H) 26.6 - 33.0 pg   MCHC 33.9 31.5 - 35.7 g/dL   RDW 91.4 78.2 - 95.6 %   Platelets 132 (L) 150 - 450 x10E3/uL   Neutrophils 56 Not Estab. %   Lymphs 33 Not Estab. %   Monocytes 8 Not Estab. %   Eos 2 Not Estab. %   Basos 1 Not Estab. %   Neutrophils Absolute 2.6 1.4 - 7.0 x10E3/uL   Lymphocytes Absolute 1.5 0.7 - 3.1 x10E3/uL   Monocytes Absolute 0.4 0.1 - 0.9 x10E3/uL   EOS (ABSOLUTE) 0.1 0.0 - 0.4 x10E3/uL   Basophils Absolute 0.0 0.0 - 0.2 x10E3/uL   Immature Granulocytes 0 Not Estab. %   Immature Grans (Abs) 0.0 0.0 - 0.1 x10E3/uL  CMP14+EGFR  Result Value Ref Range   Glucose 102 (H) 70 - 99 mg/dL   BUN 15 8 - 27 mg/dL   Creatinine, Ser 2.13 0.76 - 1.27 mg/dL   eGFR 76 >08 MV/HQI/6.96   BUN/Creatinine Ratio 14 10 - 24   Sodium 143 134 - 144 mmol/L   Potassium 4.5 3.5 - 5.2  mmol/L   Chloride 105 96 - 106 mmol/L   CO2 22  20 - 29 mmol/L   Calcium 10.0 8.6 - 10.2 mg/dL   Total Protein 6.7 6.0 - 8.5 g/dL   Albumin 4.5 3.8 - 4.8 g/dL   Globulin, Total 2.2 1.5 - 4.5 g/dL   Bilirubin Total 0.6 0.0 - 1.2 mg/dL   Alkaline Phosphatase 71 44 - 121 IU/L   AST 23 0 - 40 IU/L   ALT 22 0 - 44 IU/L  Lipid panel  Result Value Ref Range   Cholesterol, Total 144 100 - 199 mg/dL   Triglycerides 578 0 - 149 mg/dL   HDL 35 (L) >46 mg/dL   VLDL Cholesterol Cal 24 5 - 40 mg/dL   LDL Chol Calc (NIH) 85 0 - 99 mg/dL   Chol/HDL Ratio 4.1 0.0 - 5.0 ratio  TSH  Result Value Ref Range   TSH 3.830 0.450 - 4.500 uIU/mL  VITAMIN D 25 Hydroxy (Vit-D Deficiency, Fractures)  Result Value Ref Range   Vit D, 25-Hydroxy 68.9 30.0 - 100.0 ng/mL    Assessment & Plan:   Problem List Items Addressed This Visit       Genitourinary   BPH (benign prostatic hyperplasia) (Chronic)     Other   Hyperlipidemia (Chronic)   Relevant Medications   rosuvastatin (CRESTOR) 40 MG tablet   Other Visit Diagnoses     Well adult exam    -  Primary   Onychomycosis       Relevant Medications   terbinafine (LAMISIL) 250 MG tablet     Seems to be doing well, blood work looks good for the most part, discussed diet for HDL and for blood sugar being slightly elevated.  Follow up plan: Return in about 1 year (around 10/08/2023), or if symptoms worsen or fail to improve, for Physical exam.  Counseling provided for all of the vaccine components No orders of the defined types were placed in this encounter.   Arville Care, MD [facility] 10/08/2022, 8:41 AM

## 2022-12-09 DIAGNOSIS — D492 Neoplasm of unspecified behavior of bone, soft tissue, and skin: Secondary | ICD-10-CM | POA: Diagnosis not present

## 2022-12-09 DIAGNOSIS — H401133 Primary open-angle glaucoma, bilateral, severe stage: Secondary | ICD-10-CM | POA: Diagnosis not present

## 2022-12-09 DIAGNOSIS — Z961 Presence of intraocular lens: Secondary | ICD-10-CM | POA: Diagnosis not present

## 2022-12-14 ENCOUNTER — Encounter (HOSPITAL_BASED_OUTPATIENT_CLINIC_OR_DEPARTMENT_OTHER): Payer: Self-pay

## 2022-12-14 ENCOUNTER — Emergency Department (HOSPITAL_BASED_OUTPATIENT_CLINIC_OR_DEPARTMENT_OTHER): Payer: No Typology Code available for payment source | Admitting: Radiology

## 2022-12-14 ENCOUNTER — Other Ambulatory Visit: Payer: Self-pay

## 2022-12-14 ENCOUNTER — Emergency Department (HOSPITAL_BASED_OUTPATIENT_CLINIC_OR_DEPARTMENT_OTHER)
Admission: EM | Admit: 2022-12-14 | Discharge: 2022-12-14 | Disposition: A | Discharge status: Home / Self Care | Payer: No Typology Code available for payment source | Attending: Emergency Medicine | Admitting: Emergency Medicine

## 2022-12-14 DIAGNOSIS — S80211A Abrasion, right knee, initial encounter: Secondary | ICD-10-CM | POA: Diagnosis not present

## 2022-12-14 DIAGNOSIS — S82034A Nondisplaced transverse fracture of right patella, initial encounter for closed fracture: Secondary | ICD-10-CM | POA: Diagnosis not present

## 2022-12-14 DIAGNOSIS — Y93H2 Activity, gardening and landscaping: Secondary | ICD-10-CM | POA: Diagnosis not present

## 2022-12-14 DIAGNOSIS — W1830XA Fall on same level, unspecified, initial encounter: Secondary | ICD-10-CM | POA: Insufficient documentation

## 2022-12-14 DIAGNOSIS — S60512A Abrasion of left hand, initial encounter: Secondary | ICD-10-CM

## 2022-12-14 DIAGNOSIS — S60511A Abrasion of right hand, initial encounter: Secondary | ICD-10-CM | POA: Diagnosis not present

## 2022-12-14 DIAGNOSIS — S82001A Unspecified fracture of right patella, initial encounter for closed fracture: Secondary | ICD-10-CM | POA: Diagnosis not present

## 2022-12-14 DIAGNOSIS — S80212A Abrasion, left knee, initial encounter: Secondary | ICD-10-CM | POA: Diagnosis not present

## 2022-12-14 DIAGNOSIS — S0081XA Abrasion of other part of head, initial encounter: Secondary | ICD-10-CM | POA: Insufficient documentation

## 2022-12-14 DIAGNOSIS — M25461 Effusion, right knee: Secondary | ICD-10-CM | POA: Diagnosis not present

## 2022-12-14 DIAGNOSIS — S62512A Displaced fracture of proximal phalanx of left thumb, initial encounter for closed fracture: Secondary | ICD-10-CM | POA: Insufficient documentation

## 2022-12-14 DIAGNOSIS — M1711 Unilateral primary osteoarthritis, right knee: Secondary | ICD-10-CM | POA: Diagnosis not present

## 2022-12-14 NOTE — ED Triage Notes (Addendum)
Pt fell this am, weed eating and tripped over a manhole cover and fell into the road. Several abraisions noted to both knees, right hand and forehead Main complaint is rt. Knee pain Denies LOC  No blood thinners

## 2022-12-14 NOTE — Discharge Instructions (Addendum)
You presented to the ED following fall and sustaining multiple abrasions.  An x-ray was performed of your right knee due to the swelling and pain with difficulty walking.  The x-ray showed a transverse nondisplaced patella fracture.  The rest of your clinical exam was reassuring besides superficial abrasions.  These abrasions were cleaned and dressed.  Be sure to keep these abrasions clean and redress them as they heal.  You were fitted with a knee immobilizer please be sure to wear during the day.  Weight-bear as tolerated.  Take ibuprofen or Tylenol as needed.  Please make an appointment with orthopedics for follow-up and 1 to 2 weeks.  Please return to the ED if you experience any new onset headache, dizziness, blurry vision, weakness, nausea/vomiting, fevers or chills.

## 2022-12-14 NOTE — ED Provider Notes (Signed)
[place] EMERGENCY DEPARTMENT AT [facility] Provider Note   CSN: [id number] Arrival date & time: 12/14/22  1634     History  Chief Complaint  Patient presents with   Austin Clark is a 72 y.o. male with hyperlipidemia, BPH, vitamin D deficiency presents following mechanical fall while weed whacking at home.  Patient states he tripped and fell off the curb and landed on his knees and palms before scraping his head.  Patient sustained abrasions to bilateral anterior knees and bilateral palms, worse on the right, as well as right temple.  Patient denies any nausea, vomiting, headache, blurry vision.  He reports pain with ambulating due to right knee pain.  He is accompanied by his wife who states he is acting his normal self.  Last tetanus shot 4 years ago.  He is not on blood thinners.   Fall Pertinent negatives include no chest pain, no abdominal pain and no shortness of breath.       Home Medications Prior to Admission medications   Medication Sig Start Date End Date Taking? Authorizing Provider  dorzolamide-timolol (COSOPT) 22.3-6.8 MG/ML ophthalmic solution Place 1 drop into the left eye.  01/26/17   [provider]  Latanoprostene Bunod (VYZULTA) 0.024 % SOLN Apply to eye.    [provider]  Omega-3 Fatty Acids (OMEGA III EPA+DHA) 1000 MG CAPS Take 2 capsules (2,000 mg total) by mouth in the morning and at bedtime. 10/01/21   Dettinger, Elige Radon, MD  rosuvastatin (CRESTOR) 40 MG tablet Take 1 tablet (40 mg total) by mouth daily. 10/08/22   Dettinger, Elige Radon, MD  terbinafine (LAMISIL) 250 MG tablet Take 1 tablet (250 mg total) by mouth daily. 10/08/22   Dettinger, Elige Radon, MD  Vitamin D, Ergocalciferol, (DRISDOL) 1.25 MG (50000 UNIT) CAPS capsule TAKE 1 CAPSULE (50,000 UNITS TOTAL) BY MOUTH EVERY 7 (SEVEN) DAYS 08/10/22   Dettinger, Elige Radon, MD      Allergies    Penicillins    Review of Systems   Review of Systems  Respiratory:  Negative  for shortness of breath.   Cardiovascular:  Negative for chest pain.  Gastrointestinal:  Negative for abdominal pain.  Musculoskeletal:  Negative for neck pain.    Physical Exam Updated Vital Signs BP (!) 150/115   Pulse 63   Temp (!) 97.1 F (36.2 C)   Resp 18   Ht 5\' 11"  (1.803 m)   Wt 90.7 kg   SpO2 99%   BMI 27.89 kg/m  Physical Exam Vitals and nursing note reviewed.  Constitutional:      General: He is not in acute distress.    Appearance: He is well-developed.  HENT:     Head: Normocephalic.     Comments: No cranial step-offs, periorbital or retroauricular ecchymosis    Ears:     Comments: No hemotympanum bilaterally Eyes:     Conjunctiva/sclera: Conjunctivae normal.  Cardiovascular:     Rate and Rhythm: Normal rate and regular rhythm.     Heart sounds: No murmur heard. Pulmonary:     Effort: Pulmonary effort is normal. No respiratory distress.     Breath sounds: Normal breath sounds.  Abdominal:     Palpations: Abdomen is soft.     Tenderness: There is no abdominal tenderness.  Musculoskeletal:     Cervical back: Neck supple.     Comments: Moderate swelling involving the right knee with anterior patellar tenderness  Skin:    General: Skin is warm  and dry.     Capillary Refill: Capillary refill takes less than 2 seconds.     Comments: Small superficial abrasions to anterior knees.  Larger deeper fascial abrasion involving thenar eminence of right palm approximately 5 cm in length.  Minor abrasion involving left palm.  Superficial abrasion on right temple. All abrasions not actively bleeding.  Neurological:     General: No focal deficit present.     Mental Status: He is alert and oriented to person, place, and time.  Psychiatric:        Mood and Affect: Mood normal.     ED Results / Procedures / Treatments   Labs (all labs ordered are listed, but only abnormal results are displayed) Labs Reviewed - No data to display  EKG None  Radiology DG Knee  Complete 4 Views Right  Result Date: 12/14/2022 CLINICAL DATA:  Fall, abrasion EXAM: RIGHT KNEE - COMPLETE 4+ VIEW COMPARISON:  10/16/2015 FINDINGS: Acute nondisplaced fracture involving the mid pole of the patella. Moderate-sized knee joint effusion. Moderate medial compartment osteoarthritis, progressed since 2017. Mild prepatellar soft tissue swelling. IMPRESSION: 1. Acute nondisplaced fracture involving the mid pole of the patella. 2. Moderate-sized knee joint effusion. Electronically Signed   By: Duanne Guess D.O.   On: 12/14/2022 21:22    Procedures Procedures    Medications Ordered in ED Medications - No data to display  ED Course/ Medical Decision Making/ A&P                                 Medical Decision Making [name] is a 72 y.o. male with hyperlipidemia, BPH, vitamin D deficiency presents following mechanical fall while weed whacking at home.  Differential includes fracture, sprain, subdural hemorrhage, epidural hemorrhage.  Patient is accompanied by his wife.  Chart reviewed.  X-ray of right knee demonstrates transverse patellar fracture, nondisplaced.  Considered head CT given patient's age, however, patient is without any focal deficits, nausea, hemotympanum, periorbital ecchymosis, vomiting or any clinical signs suggesting dangerous mechanism,  GCS 15, and not on blood thinners.  Patient will be fitted with a knee immobilizer and sent home on crutches with referral to orthopedic follow-up.  Abrasions cleaned with iodine and bandaged with bacitracin and sterile gauze. Patient educated on wound care and discharged home with clear return precautions if he should experience any new or worsening headache, dizziness, blurry vision, weakness, nausea/vomiting, fevers or chills.  No critical interventions, cardiac monitoring or consultations indicated.  No social determinants of health.           Final Clinical Impression(s) / ED Diagnoses Final diagnoses:  Closed  nondisplaced transverse fracture of right patella, initial encounter  Abrasion of temple, initial encounter  Abrasion of both knees  Abrasion of right hand, initial encounter  Abrasion of left hand, initial encounter    Rx / DC Orders ED Discharge Orders     None         Halford Decamp, PA-C 12/14/22 2216    Anders Simmonds T, DO 12/15/22 2352

## 2022-12-14 NOTE — ED Notes (Signed)
Discharge instructions reviewed with patient. Patient questions answered and opportunity for education reviewed. Patient voices understanding of discharge instructions with no further questions. Patient to lobby via wheelchair. 

## 2022-12-14 NOTE — ED Notes (Signed)
Pt did not want crutches.

## 2022-12-15 DIAGNOSIS — M79641 Pain in right hand: Secondary | ICD-10-CM | POA: Diagnosis not present

## 2022-12-15 DIAGNOSIS — M25561 Pain in right knee: Secondary | ICD-10-CM | POA: Diagnosis not present

## 2022-12-21 DIAGNOSIS — M79641 Pain in right hand: Secondary | ICD-10-CM | POA: Diagnosis not present

## 2022-12-21 DIAGNOSIS — M25561 Pain in right knee: Secondary | ICD-10-CM | POA: Diagnosis not present

## 2022-12-28 DIAGNOSIS — M79641 Pain in right hand: Secondary | ICD-10-CM | POA: Diagnosis not present

## 2022-12-28 DIAGNOSIS — M25561 Pain in right knee: Secondary | ICD-10-CM | POA: Diagnosis not present

## 2023-01-04 DIAGNOSIS — L814 Other melanin hyperpigmentation: Secondary | ICD-10-CM | POA: Diagnosis not present

## 2023-01-04 DIAGNOSIS — L821 Other seborrheic keratosis: Secondary | ICD-10-CM | POA: Diagnosis not present

## 2023-01-04 DIAGNOSIS — L538 Other specified erythematous conditions: Secondary | ICD-10-CM | POA: Diagnosis not present

## 2023-01-04 DIAGNOSIS — L2989 Other pruritus: Secondary | ICD-10-CM | POA: Diagnosis not present

## 2023-01-04 DIAGNOSIS — Z789 Other specified health status: Secondary | ICD-10-CM | POA: Diagnosis not present

## 2023-01-04 DIAGNOSIS — L820 Inflamed seborrheic keratosis: Secondary | ICD-10-CM | POA: Diagnosis not present

## 2023-01-04 DIAGNOSIS — D225 Melanocytic nevi of trunk: Secondary | ICD-10-CM | POA: Diagnosis not present

## 2023-01-04 DIAGNOSIS — R208 Other disturbances of skin sensation: Secondary | ICD-10-CM | POA: Diagnosis not present

## 2023-01-18 ENCOUNTER — Other Ambulatory Visit: Payer: Self-pay | Admitting: Family Medicine

## 2023-01-18 DIAGNOSIS — E559 Vitamin D deficiency, unspecified: Secondary | ICD-10-CM

## 2023-03-09 DIAGNOSIS — L538 Other specified erythematous conditions: Secondary | ICD-10-CM | POA: Diagnosis not present

## 2023-03-09 DIAGNOSIS — L820 Inflamed seborrheic keratosis: Secondary | ICD-10-CM | POA: Diagnosis not present

## 2023-03-09 DIAGNOSIS — L2989 Other pruritus: Secondary | ICD-10-CM | POA: Diagnosis not present

## 2023-03-09 DIAGNOSIS — Z789 Other specified health status: Secondary | ICD-10-CM | POA: Diagnosis not present

## 2023-03-09 DIAGNOSIS — R208 Other disturbances of skin sensation: Secondary | ICD-10-CM | POA: Diagnosis not present

## 2023-04-12 DIAGNOSIS — H401133 Primary open-angle glaucoma, bilateral, severe stage: Secondary | ICD-10-CM | POA: Diagnosis not present

## 2023-07-01 ENCOUNTER — Other Ambulatory Visit: Payer: Self-pay | Admitting: Family Medicine

## 2023-07-01 DIAGNOSIS — E559 Vitamin D deficiency, unspecified: Secondary | ICD-10-CM

## 2023-08-31 DIAGNOSIS — H401133 Primary open-angle glaucoma, bilateral, severe stage: Secondary | ICD-10-CM | POA: Diagnosis not present

## 2023-09-01 ENCOUNTER — Encounter: Payer: Self-pay | Admitting: Family Medicine

## 2023-09-01 ENCOUNTER — Ambulatory Visit (INDEPENDENT_AMBULATORY_CARE_PROVIDER_SITE_OTHER): Admitting: Family Medicine

## 2023-09-01 VITALS — BP 126/62 | HR 54 | Temp 98.0°F | Ht 71.0 in | Wt 198.0 lb

## 2023-09-01 DIAGNOSIS — N400 Enlarged prostate without lower urinary tract symptoms: Secondary | ICD-10-CM

## 2023-09-01 DIAGNOSIS — N410 Acute prostatitis: Secondary | ICD-10-CM

## 2023-09-01 DIAGNOSIS — R42 Dizziness and giddiness: Secondary | ICD-10-CM | POA: Diagnosis not present

## 2023-09-01 LAB — CMP14+EGFR
ALT: 25 IU/L (ref 0–44)
AST: 24 IU/L (ref 0–40)
Albumin: 4.5 g/dL (ref 3.8–4.8)
Alkaline Phosphatase: 79 IU/L (ref 44–121)
BUN/Creatinine Ratio: 11 (ref 10–24)
BUN: 12 mg/dL (ref 8–27)
Bilirubin Total: 0.4 mg/dL (ref 0.0–1.2)
CO2: 23 mmol/L (ref 20–29)
Calcium: 9.4 mg/dL (ref 8.6–10.2)
Chloride: 107 mmol/L — ABNORMAL HIGH (ref 96–106)
Creatinine, Ser: 1.09 mg/dL (ref 0.76–1.27)
Globulin, Total: 2.1 g/dL (ref 1.5–4.5)
Glucose: 85 mg/dL (ref 70–99)
Potassium: 4.9 mmol/L (ref 3.5–5.2)
Sodium: 145 mmol/L — ABNORMAL HIGH (ref 134–144)
Total Protein: 6.6 g/dL (ref 6.0–8.5)
eGFR: 73 mL/min/1.73 (ref >59)

## 2023-09-01 LAB — CBC WITH DIFFERENTIAL/PLATELET
Basophils Absolute: 0.1 x10E3/uL (ref 0.0–0.2)
Basos: 1 %
EOS (ABSOLUTE): 0.1 x10E3/uL (ref 0.0–0.4)
Eos: 2 %
Hematocrit: 46 % (ref 37.5–51.0)
Hemoglobin: 14.8 g/dL (ref 13.0–17.7)
Immature Grans (Abs): 0 x10E3/uL (ref 0.0–0.1)
Immature Granulocytes: 0 %
Lymphocytes Absolute: 1.6 x10E3/uL (ref 0.7–3.1)
Lymphs: 31 %
MCH: 32.7 pg (ref 26.6–33.0)
MCHC: 32.2 g/dL (ref 31.5–35.7)
MCV: 102 fL — ABNORMAL HIGH (ref 79–97)
Monocytes Absolute: 0.5 x10E3/uL (ref 0.1–0.9)
Monocytes: 9 %
Neutrophils Absolute: 3 x10E3/uL (ref 1.4–7.0)
Neutrophils: 57 %
Platelets: 151 x10E3/uL (ref 150–450)
RBC: 4.52 x10E6/uL (ref 4.14–5.80)
RDW: 12.6 % (ref 11.6–15.4)
WBC: 5.2 x10E3/uL (ref 3.4–10.8)

## 2023-09-01 LAB — URINALYSIS
Bilirubin, UA: NEGATIVE
Ketones, UA: NEGATIVE
Leukocytes,UA: NEGATIVE
Nitrite, UA: NEGATIVE
Protein,UA: NEGATIVE
RBC, UA: NEGATIVE
Specific Gravity, UA: <1.005 — ABNORMAL LOW (ref 1.005–1.030)
Urobilinogen, Ur: 0.2 mg/dL (ref 0.2–1.0)
pH, UA: 6 (ref 5.0–7.5)

## 2023-09-01 MED ORDER — SULFAMETHOXAZOLE-TRIMETHOPRIM 800-160 MG PO TABS: 1.0000 | ORAL_TABLET | Freq: Two times a day (BID) | ORAL | 0 refills | Status: DC

## 2023-09-01 NOTE — Progress Notes (Signed)
 Subjective:  Patient ID: Austin Clark, male    DOB: [birth year]  Age: 73 y.o. MRN: [id number]  CC: Mass (Pt reports a lump on left side on nose. No pain but he can feel it. Not affecting breathing. ) and dizzy (Dizzy on monday and today when he first gets up in the morning. Bp was normal both times. Starts feeling better after a few hours. Always after laying down for a while. Felt sweaty on Monday when it occurred but not since. )   HPI Austin Clark presents for lesion just appeared on left side of the nose.   Two days ago awoke with dizziness. Occurs when he first stands. Wobbly. For an hour. That night he turned quickly and got swimmy headed. Things moving. Mowed yesterday. Miserable hot and sweaty playing golf. No dypnea or chest pain.      10/08/2022    8:10 AM 10/01/2021    9:33 AM 12/12/2020   10:43 AM  Depression screen PHQ 2/9  Decreased Interest 0 0 0  Down, Depressed, Hopeless 0 0 0  PHQ - 2 Score 0 0 0  Altered sleeping 0 0   Tired, decreased energy 0 0   Change in appetite 0 0   Feeling bad or failure about yourself  0 0   Trouble concentrating 0 0   Moving slowly or fidgety/restless 0 0   Suicidal thoughts 0 0   PHQ-9 Score 0 0   Difficult doing work/chores Not difficult at all Not difficult at all     History Austin Clark has a past medical history of BPH (benign prostatic hypertrophy), Colon polyps, Cough, Glaucoma (1993), Hyperlipidemia, Hypothyroid, Knee pain, Pulmonary nodule, and Tobacco use.   He has a past surgical history that includes Tonsillectomy; LASER SURGERY; Eye surgery (Bilateral); knee surgery (Right); and Colonoscopy (12/20/2015).   His family history includes Cancer in his father; Colon cancer in his father; Heart disease in his mother.He reports that he quit smoking about 26 years ago. His smoking use included cigarettes. He started smoking about 56 years ago. He has a 30 pack-year smoking history. He has never used smokeless tobacco. He reports that he  does not currently use alcohol. He reports that he does not use drugs.    ROS Review of Systems  Constitutional: Negative.   HENT: Negative.    Eyes:  Negative for visual disturbance.  Respiratory:  Negative for cough and shortness of breath.   Cardiovascular:  Negative for chest pain and leg swelling.  Gastrointestinal:  Negative for abdominal pain, diarrhea, nausea and vomiting.  Genitourinary:  Positive for flank pain and frequency. Negative for difficulty urinating.  Musculoskeletal:  Negative for arthralgias and myalgias.  Skin:  Negative for rash.  Neurological:  Positive for dizziness. Negative for headaches.  Psychiatric/Behavioral:  Negative for sleep disturbance.     Objective:  BP 126/62   Pulse (!) 54   Temp 98 F (36.7 C)   Ht 5' 11 (1.803 m)   Wt 198 lb (89.8 kg)   SpO2 98%   BMI 27.62 kg/m   BP Readings from Last 3 Encounters:  09/01/23 126/62  12/14/22 (!) 145/97  10/08/22 110/60    Wt Readings from Last 3 Encounters:  09/01/23 198 lb (89.8 kg)  12/14/22 200 lb (90.7 kg)  10/08/22 200 lb (90.7 kg)     Physical Exam Constitutional:      Appearance: He is well-developed.  HENT:     Head: Normocephalic and atraumatic.  Right Ear: Tympanic membrane and external ear normal. No decreased hearing noted.     Left Ear: Tympanic membrane and external ear normal. No decreased hearing noted.     Nose: Mucosal edema present.     Right Sinus: No frontal sinus tenderness.     Left Sinus: No frontal sinus tenderness.     Mouth/Throat:     Pharynx: No oropharyngeal exudate or posterior oropharyngeal erythema.  Eyes:     Pupils: Pupils are equal, round, and reactive to light.  Neck:     Meningeal: Brudzinski's sign absent.  Pulmonary:     Effort: No respiratory distress.     Breath sounds: Normal breath sounds. No wheezing or rales.  Abdominal:     Palpations: Abdomen is soft.     Tenderness: There is no abdominal tenderness.  Lymphadenopathy:      Head:     Right side of head: No preauricular adenopathy.     Left side of head: No preauricular adenopathy.     Cervical:     Right cervical: No superficial cervical adenopathy.    Left cervical: No superficial cervical adenopathy.  Skin:    General: Skin is warm and dry.  Neurological:     Mental Status: He is alert and oriented to person, place, and time.      Assessment & Plan:  Dizziness -     CBC with Differential/Platelet -     CMP14+EGFR -     Urinalysis  Benign prostatic hyperplasia, unspecified whether lower urinary tract symptoms present  Acute prostatitis  Other orders -     Sulfamethoxazole -Trimethoprim ; Take 1 tablet by mouth 2 (two) times daily. Until gone, for infection  Dispense: 20 tablet; Refill: 0     Follow-up: No follow-ups on file.  Butler Der, M.D.

## 2023-09-02 ENCOUNTER — Ambulatory Visit: Payer: Self-pay | Admitting: Family Medicine

## 2023-09-02 NOTE — Progress Notes (Signed)
Hello Austin Clark,  Your lab result is normal and/or stable.Some minor variations that are not significant are commonly marked abnormal, but do not represent any medical problem for you.  Best regards, Maryah Marinaro, M.D.

## 2023-09-05 ENCOUNTER — Encounter: Payer: Self-pay | Admitting: Family Medicine

## 2023-10-06 ENCOUNTER — Telehealth: Payer: Self-pay | Admitting: Family Medicine

## 2023-10-06 DIAGNOSIS — Z0000 Encounter for general adult medical examination without abnormal findings: Secondary | ICD-10-CM

## 2023-10-06 DIAGNOSIS — E7849 Other hyperlipidemia: Secondary | ICD-10-CM

## 2023-10-06 DIAGNOSIS — N400 Enlarged prostate without lower urinary tract symptoms: Secondary | ICD-10-CM

## 2023-10-06 DIAGNOSIS — E559 Vitamin D deficiency, unspecified: Secondary | ICD-10-CM

## 2023-10-06 NOTE — Telephone Encounter (Signed)
 Lab orders entered

## 2023-10-06 NOTE — Telephone Encounter (Signed)
 Please place lab orders pt has CPE appt on 10/13/23 and lab appt 10/11/2023.

## 2023-10-11 ENCOUNTER — Other Ambulatory Visit

## 2023-10-11 DIAGNOSIS — E559 Vitamin D deficiency, unspecified: Secondary | ICD-10-CM

## 2023-10-11 DIAGNOSIS — E7849 Other hyperlipidemia: Secondary | ICD-10-CM

## 2023-10-11 DIAGNOSIS — N400 Enlarged prostate without lower urinary tract symptoms: Secondary | ICD-10-CM | POA: Diagnosis not present

## 2023-10-11 DIAGNOSIS — Z0000 Encounter for general adult medical examination without abnormal findings: Secondary | ICD-10-CM

## 2023-10-11 LAB — URINALYSIS
Bilirubin, UA: NEGATIVE
Glucose, UA: NEGATIVE
Ketones, UA: NEGATIVE
Leukocytes,UA: NEGATIVE
Nitrite, UA: NEGATIVE
Protein,UA: NEGATIVE
RBC, UA: NEGATIVE
Specific Gravity, UA: 1.01 (ref 1.005–1.030)
Urobilinogen, Ur: 0.2 mg/dL (ref 0.2–1.0)
pH, UA: 6 (ref 5.0–7.5)

## 2023-10-11 LAB — CBC WITH DIFFERENTIAL/PLATELET

## 2023-10-11 LAB — LIPID PANEL

## 2023-10-12 DIAGNOSIS — H401133 Primary open-angle glaucoma, bilateral, severe stage: Secondary | ICD-10-CM | POA: Diagnosis not present

## 2023-10-12 LAB — CBC WITH DIFFERENTIAL/PLATELET
Basos: 1
EOS (ABSOLUTE): 0.1 x10E3/uL (ref 0.0–0.2)
Eos: 3
Hematocrit: 46.4 (ref 37.5–51.0)
Hemoglobin: 15.3 g/dL (ref 13.0–17.7)
Immature Granulocytes: 0
Immature Granulocytes: 0 x10E3/uL (ref 0.0–0.1)
Lymphs: 33
MCH: 33.6 pg — AB (ref 26.6–33.0)
MCHC: 33 g/dL (ref 31.5–35.7)
MCV: 102 fL — AB (ref 79–97)
Monocytes Absolute: 0.1 x10E3/uL (ref 0.0–0.4)
Monocytes Absolute: 0.3 x10E3/uL (ref 0.1–0.9)
Monocytes: 8
Neutrophils Absolute: 1.4 x10E3/uL (ref 0.7–3.1)
Neutrophils Absolute: 2.2 x10E3/uL (ref 1.4–7.0)
Neutrophils: 55
Platelets: 151 x10E3/uL (ref 150–450)
RBC: 4.55 x10E6/uL (ref 4.14–5.80)
RDW: 13 (ref 11.6–15.4)
WBC: 4.1 x10E3/uL (ref 3.4–10.8)

## 2023-10-12 LAB — LIPID PANEL
Cholesterol, Total: 138 mg/dL (ref 100–199)
HDL: 35 mg/dL — AB (ref >39)
LDL CALC COMMENT:: 3.9 ratio (ref 0.0–5.0)
LDL Chol Calc (NIH): 81 mg/dL (ref 0–99)
Triglycerides: 119 mg/dL (ref 0–149)
VLDL Cholesterol Cal: 22 mg/dL (ref 5–40)

## 2023-10-12 LAB — VITAMIN D 25 HYDROXY (VIT D DEFICIENCY, FRACTURES): Vit D, 25-Hydroxy: 63.9 ng/mL (ref 30.0–100.0)

## 2023-10-12 LAB — CMP14+EGFR
ALT: 19 IU/L (ref 0–44)
AST: 25 IU/L (ref 0–40)
Albumin: 4.5 g/dL (ref 3.8–4.8)
Alkaline Phosphatase: 73 IU/L (ref 44–121)
BUN/Creatinine Ratio: 9 — AB (ref 10–24)
BUN: 9 mg/dL (ref 8–27)
Bilirubin Total: 0.6 mg/dL (ref 0.0–1.2)
CO2: 23 mmol/L (ref 20–29)
Calcium: 9.5 mg/dL (ref 8.6–10.2)
Chloride: 106 mmol/L (ref 96–106)
Creatinine, Ser: 1.01 mg/dL (ref 0.76–1.27)
Globulin, Total: 2.2 g/dL (ref 1.5–4.5)
Glucose: 105 mg/dL — AB (ref 70–99)
Potassium: 4.9 mmol/L (ref 3.5–5.2)
Sodium: 143 mmol/L (ref 134–144)
Total Protein: 6.7 g/dL (ref 6.0–8.5)
eGFR: 79 mL/min/1.73 (ref >59)

## 2023-10-12 LAB — PSA, TOTAL AND FREE
PSA, Free Pct: 22
PSA, Free: 0.22 ng/mL
Prostate Specific Ag, Serum: 1 ng/mL (ref 0.0–4.0)

## 2023-10-12 LAB — TSH: TSH: 3.6 u[IU]/mL (ref 0.450–4.500)

## 2023-10-13 ENCOUNTER — Ambulatory Visit: Payer: No Typology Code available for payment source | Admitting: Family Medicine

## 2023-10-13 ENCOUNTER — Encounter: Payer: Self-pay | Admitting: Family Medicine

## 2023-10-13 VITALS — BP 135/72 | HR 48 | Temp 97.4°F | Ht 71.0 in | Wt 200.0 lb

## 2023-10-13 DIAGNOSIS — N400 Enlarged prostate without lower urinary tract symptoms: Secondary | ICD-10-CM | POA: Diagnosis not present

## 2023-10-13 DIAGNOSIS — E559 Vitamin D deficiency, unspecified: Secondary | ICD-10-CM

## 2023-10-13 DIAGNOSIS — Z0001 Encounter for general adult medical examination with abnormal findings: Secondary | ICD-10-CM

## 2023-10-13 DIAGNOSIS — Z0000 Encounter for general adult medical examination without abnormal findings: Secondary | ICD-10-CM

## 2023-10-13 DIAGNOSIS — E7849 Other hyperlipidemia: Secondary | ICD-10-CM

## 2023-10-13 DIAGNOSIS — Z8639 Personal history of other endocrine, nutritional and metabolic disease: Secondary | ICD-10-CM | POA: Diagnosis not present

## 2023-10-13 MED ORDER — ROSUVASTATIN CALCIUM 40 MG PO TABS: 40.0000 mg | ORAL_TABLET | Freq: Every day | ORAL | 3 refills | Status: AC

## 2023-10-13 NOTE — Progress Notes (Signed)
 BP (!) 148/80   Pulse (!) 48   Temp (!) 97.4 F (36.3 C)   Ht 5' 11 (1.803 m)   Wt 200 lb (90.7 kg)   SpO2 98%   BMI 27.89 kg/m    Subjective:   Patient ID: Austin Clark, male    DOB: [birth year], 73 y.o.   MRN: [id number]  HPI: Austin Clark is a 73 y.o. male presenting on 10/13/2023 for Annual Exam   Discussed the use of AI scribe software for clinical note transcription with the patient, who gave verbal consent to proceed.  History of Present Illness   Austin Clark is a 73 year old male who presents for a recheck and physical exam.  He continues to take rosuvastatin  for cholesterol management, vitamin D  supplements, and uses [facility] brand fish oil due to insurance changes affecting his previous prescription. Recent lab results show triglyceride levels of 135 previously and 119 currently.  He experiences persistent nasal congestion and drainage, which occurs daily and is sometimes exacerbated by outdoor exposure. He does not currently take an allergy pill but has used a nasal spray previously prescribed by another doctor. He experiences occasional sneezing and a sensation of ear popping, which temporarily improves his hearing. His wife has noted that his breathing through the nose is loud, potentially affecting his ability to hear her clearly.  He mentions a history of polyps, which has led to a recommendation for a colonoscopy every three years instead of five. He anticipates the next procedure in November.  He has experienced dizziness and dehydration, which improved over the last two weeks with increased hydration, including the use of Gatorade and Powerade. He plays golf and walks, but has been less active since his wife retired. He has been more cautious about staying hydrated, especially during physical activities in the heat.          Relevant past medical, surgical, family and social history reviewed and updated as indicated. Interim medical history since our last visit  reviewed. Allergies and medications reviewed and updated.  Review of Systems  Constitutional:  Negative for chills and fever.  HENT:  Negative for ear pain and tinnitus.   Eyes:  Negative for pain and visual disturbance.  Respiratory:  Negative for cough, shortness of breath and wheezing.   Cardiovascular:  Negative for chest pain, palpitations and leg swelling.  Gastrointestinal:  Negative for abdominal pain, blood in stool, constipation and diarrhea.  Genitourinary:  Negative for dysuria and hematuria.  Musculoskeletal:  Negative for back pain, gait problem and myalgias.  Skin:  Negative for rash.  Neurological:  Positive for dizziness. Negative for weakness and headaches.  Psychiatric/Behavioral:  Negative for suicidal ideas.   All other systems reviewed and are negative.   Per HPI unless specifically indicated above   Allergies as of 10/13/2023       Reactions   Penicillins Rash        Medication List        Accurate as of October 13, 2023  8:37 AM. If you have any questions, ask your nurse or doctor.          STOP taking these medications    sulfamethoxazole -trimethoprim  800-160 MG tablet Commonly known as: BACTRIM  DS Stopped by: Austin LABOR Angelena Sand       TAKE these medications    dorzolamide-timolol 2-0.5 % ophthalmic solution Commonly known as: COSOPT Place 1 drop into the left eye.   rosuvastatin  40 MG tablet Commonly known as: CRESTOR  Take  1 tablet (40 mg total) by mouth daily.   Vitamin D  (Ergocalciferol ) 1.25 MG (50000 UNIT) Caps capsule Commonly known as: DRISDOL  TAKE 1 CAPSULE (50,000 UNITS TOTAL) BY MOUTH EVERY 7 (SEVEN) DAYS   Vyzulta 0.024 % Soln Generic drug: Latanoprostene Bunod Apply to eye.         Objective:   BP (!) 148/80   Pulse (!) 48   Temp (!) 97.4 F (36.3 C)   Ht 5' 11 (1.803 m)   Wt 200 lb (90.7 kg)   SpO2 98%   BMI 27.89 kg/m   Wt Readings from Last 3 Encounters:  10/13/23 200 lb (90.7 kg)  09/01/23 198  lb (89.8 kg)  12/14/22 200 lb (90.7 kg)    Physical Exam Vitals and nursing note reviewed.    Physical Exam   HEENT: Pressure behind eardrum, no cerumen. Throat clear. NECK: Thyroid  without thyromegaly or nodules. CHEST: Lungs clear to auscultation bilaterally. CARDIOVASCULAR: Regular rate and rhythm, no murmurs. ABDOMEN: Abdomen soft, non-tender, no masses. RECTAL: Prostate normal size and consistency, non-tender. EXTREMITIES: No edema, good pulses bilaterally.       Results for orders placed or performed in visit on 10/11/23  Urinalysis   Collection Time: 10/11/23  9:39 AM  Result Value Ref Range   Specific Gravity, UA 1.010 1.005 - 1.030   pH, UA 6.0 5.0 - 7.5   Color, UA Yellow Yellow   Appearance Ur Clear Clear   Leukocytes,UA Negative Negative   Protein,UA Negative Negative/Trace   Glucose, UA Negative Negative   Ketones, UA Negative Negative   RBC, UA Negative Negative   Bilirubin, UA Negative Negative   Urobilinogen, Ur 0.2 0.2 - 1.0 mg/dL   Nitrite, UA Negative Negative  TSH   Collection Time: 10/11/23  9:49 AM  Result Value Ref Range   TSH 3.600 0.450 - 4.500 uIU/mL  VITAMIN D  25 Hydroxy (Vit-D Deficiency, Fractures)   Collection Time: 10/11/23  9:49 AM  Result Value Ref Range   Vit D, 25-Hydroxy 63.9 30.0 - 100.0 ng/mL  PSA, total and free   Collection Time: 10/11/23  9:49 AM  Result Value Ref Range   Prostate Specific Ag, Serum 1.0 0.0 - 4.0 ng/mL   PSA, Free 0.22 N/A ng/mL   PSA, Free Pct 22.0 %  Lipid panel   Collection Time: 10/11/23  9:49 AM  Result Value Ref Range   Cholesterol, Total 138 100 - 199 mg/dL   Triglycerides 880 0 - 149 mg/dL   HDL 35 (L) >60 mg/dL   VLDL Cholesterol Cal 22 5 - 40 mg/dL   LDL Chol Calc (NIH) 81 0 - 99 mg/dL   Chol/HDL Ratio 3.9 0.0 - 5.0 ratio  CMP14+EGFR   Collection Time: 10/11/23  9:49 AM  Result Value Ref Range   Glucose 105 (H) 70 - 99 mg/dL   BUN 9 8 - 27 mg/dL   Creatinine, Ser 8.98 0.76 - 1.27 mg/dL    eGFR 79 >40 fO/fpw/8.26   BUN/Creatinine Ratio 9 (L) 10 - 24   Sodium 143 134 - 144 mmol/L   Potassium 4.9 3.5 - 5.2 mmol/L   Chloride 106 96 - 106 mmol/L   CO2 23 20 - 29 mmol/L   Calcium  9.5 8.6 - 10.2 mg/dL   Total Protein 6.7 6.0 - 8.5 g/dL   Albumin 4.5 3.8 - 4.8 g/dL   Globulin, Total 2.2 1.5 - 4.5 g/dL   Bilirubin Total 0.6 0.0 - 1.2 mg/dL   Alkaline Phosphatase  73 44 - 121 IU/L   AST 25 0 - 40 IU/L   ALT 19 0 - 44 IU/L  CBC with Differential/Platelet   Collection Time: 10/11/23  9:49 AM  Result Value Ref Range   WBC 4.1 3.4 - 10.8 x10E3/uL   RBC 4.55 4.14 - 5.80 x10E6/uL   Hemoglobin 15.3 13.0 - 17.7 g/dL   Hematocrit 53.5 62.4 - 51.0 %   MCV 102 (H) 79 - 97 fL   MCH 33.6 (H) 26.6 - 33.0 pg   MCHC 33.0 31.5 - 35.7 g/dL   RDW 86.9 88.3 - 84.5 %   Platelets 151 150 - 450 x10E3/uL   Neutrophils 55 Not Estab. %   Lymphs 33 Not Estab. %   Monocytes 8 Not Estab. %   Eos 3 Not Estab. %   Basos 1 Not Estab. %   Neutrophils Absolute 2.2 1.4 - 7.0 x10E3/uL   Lymphocytes Absolute 1.4 0.7 - 3.1 x10E3/uL   Monocytes Absolute 0.3 0.1 - 0.9 x10E3/uL   EOS (ABSOLUTE) 0.1 0.0 - 0.4 x10E3/uL   Basophils Absolute 0.1 0.0 - 0.2 x10E3/uL   Immature Granulocytes 0 Not Estab. %   Immature Grans (Abs) 0.0 0.0 - 0.1 x10E3/uL    Assessment & Plan:   Problem List Items Addressed This Visit       Genitourinary   BPH (benign prostatic hyperplasia) (Chronic)     Other   Hyperlipidemia (Chronic)   Relevant Medications   rosuvastatin  (CRESTOR ) 40 MG tablet   Other Relevant Orders   CMP14+EGFR   Lipid panel   Vitamin D  deficiency   History of hypothyroidism (Chronic)   Relevant Orders   TSH   Other Visit Diagnoses       Physical exam    -  Primary   Relevant Orders   CBC with Differential/Platelet   CMP14+EGFR   TSH          Allergic rhinitis with chronic nasal congestion and associated ear pressure Chronic nasal congestion and ear pressure likely due to allergic  rhinitis, with possible temporary hearing impact from congestion-related pressure changes. - Recommend daily loratadine. - Advise Flonase  as needed for congestion. - Consider ENT or allergy specialist referral if symptoms persist.  Hyperlipidemia Hyperlipidemia well-controlled with rosuvastatin  and fish oil. Recent labs show triglycerides at 119 mg/dL, improved from 864 mg/dL. - Continue rosuvastatin . - Continue [employer] brand fish oil. - Encourage purchase of fish oil during current promotion.  Benign prostatic hyperplasia without urinary symptoms Benign prostatic hyperplasia without urinary symptoms. PSA level is 1.0. - Continue monitoring PSA levels. - Perform prostate exam during visit.  History of dehydration and possible vertigo, resolved Previous dehydration with possible vertigo resolved with hydration and electrolytes. No current dizziness or vertigo. - Advise continued hydration, especially during physical activities. - Recommend Gatorade or Powerade for electrolyte replenishment. - Monitor for recurrence of symptoms.          Follow up plan: Return in about 6 months (around 04/14/2024), or if symptoms worsen or fail to improve, for Recheck cholesterol.  Counseling provided for all of the vaccine components Orders Placed This Encounter  Procedures   CBC with Differential/Platelet   CMP14+EGFR   Lipid panel   TSH    Austin Levins, MD [facility] 10/13/2023, 8:37 AM

## 2023-12-01 DIAGNOSIS — Z008 Encounter for other general examination: Secondary | ICD-10-CM | POA: Diagnosis not present

## 2023-12-01 DIAGNOSIS — E785 Hyperlipidemia, unspecified: Secondary | ICD-10-CM | POA: Diagnosis not present

## 2023-12-01 DIAGNOSIS — Z6827 Body mass index (BMI) 27.0-27.9, adult: Secondary | ICD-10-CM | POA: Diagnosis not present

## 2023-12-01 DIAGNOSIS — E663 Overweight: Secondary | ICD-10-CM | POA: Diagnosis not present

## 2023-12-01 DIAGNOSIS — F17211 Nicotine dependence, cigarettes, in remission: Secondary | ICD-10-CM | POA: Diagnosis not present

## 2023-12-08 ENCOUNTER — Encounter: Payer: Self-pay | Admitting: Internal Medicine

## 2023-12-16 ENCOUNTER — Other Ambulatory Visit: Payer: Self-pay | Admitting: Family Medicine

## 2023-12-16 DIAGNOSIS — E559 Vitamin D deficiency, unspecified: Secondary | ICD-10-CM

## 2023-12-21 ENCOUNTER — Ambulatory Visit (AMBULATORY_SURGERY_CENTER)

## 2023-12-21 ENCOUNTER — Encounter

## 2023-12-21 VITALS — Ht 71.0 in | Wt 199.0 lb

## 2023-12-21 DIAGNOSIS — Z83719 Family history of colon polyps, unspecified: Secondary | ICD-10-CM

## 2023-12-21 DIAGNOSIS — Z800 Family history of malignant neoplasm of digestive organs: Secondary | ICD-10-CM

## 2023-12-21 MED ORDER — NA SULFATE-K SULFATE-MG SULF 17.5-3.13-1.6 GM/177ML PO SOLN: 1.0000 | Freq: Once | ORAL | 0 refills | Status: AC

## 2023-12-21 NOTE — Progress Notes (Signed)

## 2024-01-04 ENCOUNTER — Encounter: Payer: Self-pay | Admitting: Internal Medicine

## 2024-01-07 ENCOUNTER — Encounter: Payer: Self-pay | Admitting: Gastroenterology

## 2024-01-07 ENCOUNTER — Telehealth: Payer: Self-pay | Admitting: Gastroenterology

## 2024-01-07 NOTE — Telephone Encounter (Signed)
 Inbound call from patient stating he was switched from Dr.Pyrtle to Dr.Nandigam for a procedure on 01/11/24 and he would like to know if she also takes [employer] insurance  Requesting a call back  Please advise  Thank you

## 2024-01-11 ENCOUNTER — Encounter: Payer: Self-pay | Admitting: Gastroenterology

## 2024-01-11 ENCOUNTER — Ambulatory Visit (AMBULATORY_SURGERY_CENTER): Admitting: Gastroenterology

## 2024-01-11 VITALS — BP 125/77 | HR 51 | Temp 97.9°F | Resp 11 | Ht 71.0 in | Wt 199.0 lb

## 2024-01-11 DIAGNOSIS — D123 Benign neoplasm of transverse colon: Secondary | ICD-10-CM | POA: Diagnosis not present

## 2024-01-11 DIAGNOSIS — Z860101 Personal history of adenomatous and serrated colon polyps: Secondary | ICD-10-CM

## 2024-01-11 DIAGNOSIS — K644 Residual hemorrhoidal skin tags: Secondary | ICD-10-CM | POA: Diagnosis not present

## 2024-01-11 DIAGNOSIS — K648 Other hemorrhoids: Secondary | ICD-10-CM | POA: Diagnosis not present

## 2024-01-11 DIAGNOSIS — Z83719 Family history of colon polyps, unspecified: Secondary | ICD-10-CM

## 2024-01-11 DIAGNOSIS — Z800 Family history of malignant neoplasm of digestive organs: Secondary | ICD-10-CM

## 2024-01-11 DIAGNOSIS — D122 Benign neoplasm of ascending colon: Secondary | ICD-10-CM | POA: Diagnosis not present

## 2024-01-11 DIAGNOSIS — Z1211 Encounter for screening for malignant neoplasm of colon: Secondary | ICD-10-CM | POA: Diagnosis present

## 2024-01-11 DIAGNOSIS — D175 Benign lipomatous neoplasm of intra-abdominal organs: Secondary | ICD-10-CM | POA: Diagnosis not present

## 2024-01-11 MED ORDER — SODIUM CHLORIDE 0.9 % IV SOLN: 500.0000 mL | Freq: Once | INTRAVENOUS | Status: DC

## 2024-01-11 NOTE — Op Note (Signed)
  [facility] Patient Name: Austin Clark Procedure Date: 01/11/2024 3:23 PM MRN: [id number] Endoscopist: Austin Clark , MD, [id number] Age: 73 Referring MD:  Date of Birth: 02-28-1950 Gender: Male Account #: [id number] Procedure:                Colonoscopy Indications:              High risk colon cancer surveillance: Personal                            history of colonic polyps, High risk colon cancer                            surveillance: Personal history of adenoma (10 mm or                            greater in size), High risk colon cancer                            surveillance: Personal history of multiple (3 or                            more) adenomas Medicines:                Monitored Anesthesia Care Procedure:                Pre-Anesthesia Assessment:                           - Prior to the procedure, a History and Physical                            was performed, and patient medications and                            allergies were reviewed. The patient's tolerance of                            previous anesthesia was also reviewed. The risks                            and benefits of the procedure and the sedation                            options and risks were discussed with the patient.                            All questions were answered, and informed consent                            was obtained. Prior Anticoagulants: The patient has                            taken no anticoagulant or antiplatelet agents. ASA  Grade Assessment: II - A patient with mild systemic                            disease. After reviewing the risks and benefits,                            the patient was deemed in satisfactory condition to                            undergo the procedure.                           After obtaining informed consent, the colonoscope                            was passed under direct vision. Throughout the                             procedure, the patient's blood pressure, pulse, and                            oxygen saturations were monitored continuously. The                            PCF-HQ190L Colonoscope [id number] was introduced                            through the anus and advanced to the the cecum,                            identified by appendiceal orifice and ileocecal                            valve. The colonoscopy was performed without                            difficulty. The patient tolerated the procedure                            well. The quality of the bowel preparation was                            good. The ileocecal valve, appendiceal orifice, and                            rectum were photographed. Scope In: 3:27:23 PM Scope Out: 3:43:33 PM Scope Withdrawal Time: 0 hours 8 minutes 9 seconds  Total Procedure Duration: 0 hours 16 minutes 10 seconds  Findings:                 The perianal and digital rectal examinations were                            normal.  Two sessile polyps were found in the transverse                            colon and ascending colon. The polyps were 3 to 5                            mm in size. These polyps were removed with a cold                            snare. Resection and retrieval were complete.                           There was a small lipoma, 15 mm in diameter, in the                            ascending colon.                           Non-bleeding external and internal hemorrhoids were                            found during retroflexion. The hemorrhoids were                            medium-sized. Complications:            No immediate complications. Estimated Blood Loss:     Estimated blood loss was minimal. Impression:               - Two 3 to 5 mm polyps in the transverse colon and                            in the ascending colon, removed with a cold snare.                            Resected and  retrieved.                           - Small lipoma in the ascending colon.                           - Non-bleeding external and internal hemorrhoids. Recommendation:           - Resume previous diet.                           - Continue present medications.                           - Await pathology results.                           - Repeat colonoscopy in 5 years for surveillance                            based on pathology results  with Dr.Pyrtle. [name] Cutting, MD 01/11/2024 3:49:23 PM This report has been signed electronically.

## 2024-01-11 NOTE — Progress Notes (Signed)
 Vss nad trans to pacu

## 2024-01-11 NOTE — Progress Notes (Signed)
 Called to room to assist during endoscopic procedure.  Patient ID and intended procedure confirmed with present staff. Received instructions for my participation in the procedure from the performing physician.

## 2024-01-11 NOTE — Progress Notes (Signed)
 [facility] History and Physical   Primary Care Physician:  Dettinger, Fonda LABOR, MD   Reason for Procedure:  History of adenomatous colon polyps  Plan:    Surveillance colonoscopy with possible interventions as needed     HPI: Austin Clark is a very pleasant 73 y.o. male here for surveillance colonoscopy. Denies any nausea, vomiting, abdominal pain, melena or bright red blood per rectum  The risks and benefits as well as alternatives of endoscopic procedure(s) have been discussed and reviewed.  The patient was provided an opportunity to ask questions and all were answered. The patient agreed with the plan and demonstrated an understanding of the instructions.   Past Medical History:  Diagnosis Date   BPH (benign prostatic hypertrophy)    Colon polyps    Cough    Glaucoma 1993   Hyperlipidemia    Hypothyroid    Knee pain    Pulmonary nodule    Tobacco use     Past Surgical History:  Procedure Laterality Date   COLONOSCOPY  12/20/2015   Dr.Pyrtle   EYE SURGERY Bilateral    cataract   knee surgery Right    cleaned   LASER SURGERY     GLAUCOMA    TONSILLECTOMY      Prior to Admission medications   Medication Sig Start Date End Date Taking? Authorizing Provider  dorzolamide-timolol (COSOPT) 22.3-6.8 MG/ML ophthalmic solution Place 1 drop into the left eye.  01/26/17  Yes [provider]  Latanoprostene Bunod (VYZULTA) 0.024 % SOLN Apply to eye.   Yes [provider]  Omega-3 Fatty Acids (FISH OIL CONCENTRATE PO) Take by mouth.   Yes [provider]  rosuvastatin  (CRESTOR ) 40 MG tablet Take 1 tablet (40 mg total) by mouth daily. 10/13/23  Yes Dettinger, Fonda LABOR, MD  Vitamin D , Ergocalciferol , (DRISDOL ) 1.25 MG (50000 UNIT) CAPS capsule TAKE 1 CAPSULE (50,000 UNITS TOTAL) BY MOUTH EVERY 7 (SEVEN) DAYS 12/17/23  Yes Dettinger, Fonda LABOR, MD    Current Outpatient Medications  Medication Sig Dispense Refill   dorzolamide-timolol  (COSOPT) 22.3-6.8 MG/ML ophthalmic solution Place 1 drop into the left eye.   11   Latanoprostene Bunod (VYZULTA) 0.024 % SOLN Apply to eye.     Omega-3 Fatty Acids (FISH OIL CONCENTRATE PO) Take by mouth.     rosuvastatin  (CRESTOR ) 40 MG tablet Take 1 tablet (40 mg total) by mouth daily. 90 tablet 3   Vitamin D , Ergocalciferol , (DRISDOL ) 1.25 MG (50000 UNIT) CAPS capsule TAKE 1 CAPSULE (50,000 UNITS TOTAL) BY MOUTH EVERY 7 (SEVEN) DAYS 12 capsule 0   Current Facility-Administered Medications  Medication Dose Route Frequency Provider Last Rate Last Admin   0.9 %  sodium chloride  infusion  500 mL Intravenous Once Nadiya Pieratt V, MD        Allergies as of 01/11/2024 - Review Complete 01/11/2024  Allergen Reaction Noted   Penicillins Rash 11/02/2012    Family History  Problem Relation Age of Onset   Heart disease Mother    Colon cancer Father        mid 85's   Cancer Father        colon cancer   Colon polyps Neg Hx    Esophageal cancer Neg Hx    Rectal cancer Neg Hx    Stomach cancer Neg Hx     Social History   Socioeconomic History   Marital status: Married    Spouse name: Austin Clark   Number of children: 2   Years of  education: 12   Highest education level: 12th grade  Occupational History   Occupation: fulltime    Employer: [employer]   Occupation: Retired  Tobacco Use   Smoking status: Former    Current packs/day: 0.00    Average packs/day: 1 pack/day for 30.0 years (30.0 ttl pk-yrs)    Types: Cigarettes    Start date: 02/17/1967    Quit date: 02/16/1997    Years since quitting: 26.9   Smokeless tobacco: Never   Tobacco comments:    Rare cigarettes  Vaping Use   Vaping status: Never Used  Substance and Sexual Activity   Alcohol use: Not Currently    Comment: previous   Drug use: No   Sexual activity: Yes    Birth control/protection: None  Other Topics Concern   Not on file  Social History Narrative   Lives in 2 story home with wife    Social Drivers of Health   Financial Resource Strain: Low Risk  (08/31/2023)   Overall Financial Resource Strain (CARDIA)    Difficulty of Paying Living Expenses: Not hard at all  Food Insecurity: No Food Insecurity (08/31/2023)   Hunger Vital Sign    Worried About Running Out of Food in the Last Year: Never true    Ran Out of Food in the Last Year: Never true  Transportation Needs: No Transportation Needs (08/31/2023)   PRAPARE - Administrator, Civil Service (Medical): No    Lack of Transportation (Non-Medical): No  Physical Activity: Sufficiently Active (08/31/2023)   Exercise Vital Sign    Days of Exercise per Week: 3 days    Minutes of Exercise per Session: 70 min  Stress: No Stress Concern Present (08/31/2023)   [employer] of Occupational Health - Occupational Stress Questionnaire    Feeling of Stress: Not at all  Social Connections: Moderately Isolated (08/31/2023)   Social Connection and Isolation Panel    Frequency of Communication with Friends and Family: Three times a week    Frequency of Social Gatherings with Friends and Family: Three times a week    Attends Religious Services: Never    Active Member of Clubs or Organizations: No    Attends Banker Meetings: Not on file    Marital Status: Married  Catering Manager Violence: Not At Risk (12/12/2020)   Humiliation, Afraid, Rape, and Kick questionnaire    Fear of Current or Ex-Partner: No    Emotionally Abused: No    Physically Abused: No    Sexually Abused: No    Review of Systems:  All other review of systems negative except as mentioned in the HPI.  Physical Exam: Vital signs in last 24 hours: BP 133/73   Pulse 96   Temp 97.9 F (36.6 C)   Ht 5' 11 (1.803 m)   Wt 199 lb (90.3 kg)   SpO2 98%   BMI 27.75 kg/m  General:   Alert, NAD Lungs:  Clear .   Heart:  Regular rate and rhythm Abdomen:  Soft, nontender and nondistended. Neuro/Psych:  Alert and cooperative. Normal mood  and affect. A and O x 3  Reviewed labs, radiology imaging, old records and pertinent past GI work up  Patient is appropriate for planned procedure(s) and anesthesia in an ambulatory setting   K. Veena Destin Kittler , MD [phone]

## 2024-01-11 NOTE — Patient Instructions (Addendum)
 Resume previous diet.                           - Continue present medications.                           - Await pathology results.                           - Repeat colonoscopy in 5 years for surveillance                            based on pathology results with Dr.Pyrtle  Handout on polyps, diverticulosis, hemorrhoids     YOU HAD AN ENDOSCOPIC PROCEDURE TODAY AT THE [facility]:   Refer to the procedure report that was given to you for any specific questions about what was found during the examination.  If the procedure report does not answer your questions, please call your gastroenterologist to clarify.  If you requested that your care partner not be given the details of your procedure findings, then the procedure report has been included in a sealed envelope for you to review at your convenience later.  YOU SHOULD EXPECT: Some feelings of bloating in the abdomen. Passage of more gas than usual.  Walking can help get rid of the air that was put into your GI tract during the procedure and reduce the bloating. If you had a lower endoscopy (such as a colonoscopy or flexible sigmoidoscopy) you may notice spotting of blood in your stool or on the toilet paper. If you underwent a bowel prep for your procedure, you may not have a normal bowel movement for a few days.  Please Note:  You might notice some irritation and congestion in your nose or some drainage.  This is from the oxygen used during your procedure.  There is no need for concern and it should clear up in a day or so.  SYMPTOMS TO REPORT IMMEDIATELY:  Following lower endoscopy (colonoscopy or flexible sigmoidoscopy):  Excessive amounts of blood in the stool  Significant tenderness or worsening of abdominal pains  Swelling of the abdomen that is new, acute  Fever of 100F or higher   For urgent or emergent issues, a gastroenterologist can be reached at any hour by calling [phone]. Do not use MyChart messaging  for urgent concerns.    DIET:  We do recommend a small meal at first, but then you may proceed to your regular diet.  Drink plenty of fluids but you should avoid alcoholic beverages for 24 hours.  ACTIVITY:  You should plan to take it easy for the rest of today and you should NOT DRIVE or use heavy machinery until tomorrow (because of the sedation medicines used during the test).    FOLLOW UP: Our staff will call the number listed on your records the next business day following your procedure.  We will call around 7:15- 8:00 am to check on you and address any questions or concerns that you may have regarding the information given to you following your procedure. If we do not reach you, we will leave a message.     If any biopsies were taken you will be contacted by phone or by letter within the next 1-3 weeks.  Please call us  at [phone] if you have not heard  about the biopsies in 3 weeks.    SIGNATURES/CONFIDENTIALITY: You and/or your care partner have signed paperwork which will be entered into your electronic medical record.  These signatures attest to the fact that that the information above on your After Visit Summary has been reviewed and is understood.  Full responsibility of the confidentiality of this discharge information lies with you and/or your care-partner.

## 2024-01-11 NOTE — Progress Notes (Signed)
 Pt's states no medical or surgical changes since previsit or office visit.

## 2024-01-12 NOTE — Telephone Encounter (Signed)
  Follow up Call-     01/11/2024    2:17 PM  Call back number  Post procedure Call Back phone  # [phone]  Permission to leave phone message Yes     Patient questions:  Do you have a fever, pain , or abdominal swelling? No. Pain Score  0 *  Have you tolerated food without any problems? Yes.    Have you been able to return to your normal activities? Yes.    Do you have any questions about your discharge instructions: Diet   No. Medications  No. Follow up visit  No.  Do you have questions or concerns about your Care? No.  Actions: * If pain score is 4 or above: No action needed, pain <4.

## 2024-01-18 LAB — SURGICAL PATHOLOGY

## 2024-02-02 ENCOUNTER — Ambulatory Visit: Payer: Self-pay | Admitting: Gastroenterology

## 2024-03-05 ENCOUNTER — Other Ambulatory Visit: Payer: Self-pay | Admitting: Family Medicine

## 2024-03-05 DIAGNOSIS — E559 Vitamin D deficiency, unspecified: Secondary | ICD-10-CM

## 2024-04-17 ENCOUNTER — Ambulatory Visit: Payer: Self-pay | Admitting: Family Medicine
